# Patient Record
Sex: Female | Born: 1980 | Race: Black or African American | Hispanic: No | Marital: Single | Smoking: Never smoker
Health system: Southern US, Community
[De-identification: ages and names within clinical notes are randomized; demographics above are authoritative.]

## PROBLEM LIST (undated history)

## (undated) ENCOUNTER — Inpatient Hospital Stay (HOSPITAL_COMMUNITY): Payer: Self-pay

## (undated) DIAGNOSIS — IMO0002 Reserved for concepts with insufficient information to code with codable children: Secondary | ICD-10-CM

## (undated) DIAGNOSIS — N83209 Unspecified ovarian cyst, unspecified side: Secondary | ICD-10-CM

## (undated) DIAGNOSIS — M5430 Sciatica, unspecified side: Secondary | ICD-10-CM

## (undated) DIAGNOSIS — G510 Bell's palsy: Secondary | ICD-10-CM

## (undated) HISTORY — PX: NO PAST SURGERIES: SHX2092

## (undated) HISTORY — PX: WISDOM TOOTH EXTRACTION: SHX21

---
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
## 2002-01-13 ENCOUNTER — Inpatient Hospital Stay (HOSPITAL_COMMUNITY): Admission: AD | Admit: 2002-01-13 | Discharge: 2002-01-13 | Payer: Self-pay | Admitting: *Deleted

## 2002-01-17 ENCOUNTER — Inpatient Hospital Stay (HOSPITAL_COMMUNITY): Admission: AD | Admit: 2002-01-17 | Discharge: 2002-01-17 | Payer: Self-pay | Admitting: *Deleted

## 2002-03-19 ENCOUNTER — Emergency Department (HOSPITAL_COMMUNITY): Admission: EM | Admit: 2002-03-19 | Discharge: 2002-03-19 | Payer: Self-pay

## 2002-09-25 ENCOUNTER — Inpatient Hospital Stay (HOSPITAL_COMMUNITY): Admission: AD | Admit: 2002-09-25 | Discharge: 2002-09-25 | Payer: Self-pay | Admitting: Obstetrics and Gynecology

## 2003-08-29 ENCOUNTER — Inpatient Hospital Stay (HOSPITAL_COMMUNITY): Admission: AD | Admit: 2003-08-29 | Discharge: 2003-08-29 | Payer: Self-pay | Admitting: Obstetrics and Gynecology

## 2003-12-20 ENCOUNTER — Inpatient Hospital Stay (HOSPITAL_COMMUNITY): Admission: AD | Admit: 2003-12-20 | Discharge: 2003-12-20 | Payer: Self-pay | Admitting: *Deleted

## 2004-02-13 ENCOUNTER — Emergency Department (HOSPITAL_COMMUNITY): Admission: EM | Admit: 2004-02-13 | Discharge: 2004-02-13 | Payer: Self-pay | Admitting: Emergency Medicine

## 2004-08-08 ENCOUNTER — Inpatient Hospital Stay (HOSPITAL_COMMUNITY): Admission: AD | Admit: 2004-08-08 | Discharge: 2004-08-08 | Payer: Self-pay | Admitting: Obstetrics

## 2004-08-18 ENCOUNTER — Emergency Department (HOSPITAL_COMMUNITY): Admission: EM | Admit: 2004-08-18 | Discharge: 2004-08-18 | Payer: Self-pay | Admitting: Emergency Medicine

## 2004-08-20 ENCOUNTER — Emergency Department (HOSPITAL_COMMUNITY): Admission: EM | Admit: 2004-08-20 | Discharge: 2004-08-20 | Payer: Self-pay | Admitting: Emergency Medicine

## 2004-11-20 ENCOUNTER — Inpatient Hospital Stay (HOSPITAL_COMMUNITY): Admission: AD | Admit: 2004-11-20 | Discharge: 2004-11-20 | Payer: Self-pay | Admitting: Obstetrics

## 2005-02-14 ENCOUNTER — Inpatient Hospital Stay (HOSPITAL_COMMUNITY): Admission: AD | Admit: 2005-02-14 | Discharge: 2005-02-14 | Payer: Self-pay | Admitting: Obstetrics and Gynecology

## 2005-04-26 ENCOUNTER — Inpatient Hospital Stay (HOSPITAL_COMMUNITY): Admission: AD | Admit: 2005-04-26 | Discharge: 2005-04-26 | Payer: Self-pay | Admitting: Pediatrics

## 2005-05-20 ENCOUNTER — Inpatient Hospital Stay (HOSPITAL_COMMUNITY): Admission: AD | Admit: 2005-05-20 | Discharge: 2005-05-20 | Payer: Self-pay | Admitting: Obstetrics and Gynecology

## 2005-06-07 ENCOUNTER — Inpatient Hospital Stay (HOSPITAL_COMMUNITY): Admission: AD | Admit: 2005-06-07 | Discharge: 2005-06-07 | Payer: Self-pay | Admitting: Obstetrics and Gynecology

## 2005-06-18 ENCOUNTER — Inpatient Hospital Stay (HOSPITAL_COMMUNITY): Admission: AD | Admit: 2005-06-18 | Discharge: 2005-06-21 | Payer: Self-pay | Admitting: Obstetrics and Gynecology

## 2005-06-19 ENCOUNTER — Encounter (INDEPENDENT_AMBULATORY_CARE_PROVIDER_SITE_OTHER): Payer: Self-pay | Admitting: Specialist

## 2005-07-22 ENCOUNTER — Other Ambulatory Visit: Admission: RE | Admit: 2005-07-22 | Discharge: 2005-07-22 | Payer: Self-pay | Admitting: Obstetrics and Gynecology

## 2006-06-25 ENCOUNTER — Inpatient Hospital Stay (HOSPITAL_COMMUNITY): Admission: AD | Admit: 2006-06-25 | Discharge: 2006-06-25 | Payer: Self-pay | Admitting: Obstetrics and Gynecology

## 2006-07-14 ENCOUNTER — Inpatient Hospital Stay (HOSPITAL_COMMUNITY): Admission: AD | Admit: 2006-07-14 | Discharge: 2006-07-14 | Payer: Self-pay | Admitting: Obstetrics and Gynecology

## 2006-07-16 ENCOUNTER — Inpatient Hospital Stay (HOSPITAL_COMMUNITY): Admission: AD | Admit: 2006-07-16 | Discharge: 2006-07-16 | Payer: Self-pay | Admitting: Obstetrics & Gynecology

## 2006-10-26 ENCOUNTER — Inpatient Hospital Stay (HOSPITAL_COMMUNITY): Admission: AD | Admit: 2006-10-26 | Discharge: 2006-10-26 | Payer: Self-pay | Admitting: Obstetrics and Gynecology

## 2006-11-09 ENCOUNTER — Inpatient Hospital Stay (HOSPITAL_COMMUNITY): Admission: AD | Admit: 2006-11-09 | Discharge: 2006-11-09 | Payer: Self-pay | Admitting: Obstetrics and Gynecology

## 2006-12-01 ENCOUNTER — Inpatient Hospital Stay (HOSPITAL_COMMUNITY): Admission: AD | Admit: 2006-12-01 | Discharge: 2006-12-01 | Payer: Self-pay | Admitting: Obstetrics and Gynecology

## 2006-12-16 ENCOUNTER — Inpatient Hospital Stay (HOSPITAL_COMMUNITY): Admission: AD | Admit: 2006-12-16 | Discharge: 2006-12-16 | Payer: Self-pay | Admitting: Obstetrics and Gynecology

## 2006-12-27 ENCOUNTER — Inpatient Hospital Stay (HOSPITAL_COMMUNITY): Admission: AD | Admit: 2006-12-27 | Discharge: 2006-12-27 | Payer: Self-pay | Admitting: Obstetrics and Gynecology

## 2006-12-30 ENCOUNTER — Inpatient Hospital Stay (HOSPITAL_COMMUNITY): Admission: AD | Admit: 2006-12-30 | Discharge: 2007-01-02 | Payer: Self-pay | Admitting: Obstetrics and Gynecology

## 2007-05-16 ENCOUNTER — Emergency Department (HOSPITAL_COMMUNITY): Admission: EM | Admit: 2007-05-16 | Discharge: 2007-05-16 | Payer: Self-pay | Admitting: Emergency Medicine

## 2008-04-10 ENCOUNTER — Inpatient Hospital Stay (HOSPITAL_COMMUNITY): Admission: AD | Admit: 2008-04-10 | Discharge: 2008-04-10 | Payer: Self-pay | Admitting: Obstetrics and Gynecology

## 2008-04-30 ENCOUNTER — Emergency Department (HOSPITAL_COMMUNITY): Admission: EM | Admit: 2008-04-30 | Discharge: 2008-04-30 | Payer: Self-pay | Admitting: Emergency Medicine

## 2008-05-02 ENCOUNTER — Emergency Department (HOSPITAL_COMMUNITY): Admission: EM | Admit: 2008-05-02 | Discharge: 2008-05-03 | Payer: Self-pay | Admitting: Emergency Medicine

## 2008-05-06 ENCOUNTER — Emergency Department (HOSPITAL_COMMUNITY): Admission: EM | Admit: 2008-05-06 | Discharge: 2008-05-06 | Payer: Self-pay | Admitting: Emergency Medicine

## 2008-05-07 ENCOUNTER — Emergency Department (HOSPITAL_COMMUNITY): Admission: EM | Admit: 2008-05-07 | Discharge: 2008-05-07 | Payer: Self-pay | Admitting: Emergency Medicine

## 2009-05-11 ENCOUNTER — Inpatient Hospital Stay (HOSPITAL_COMMUNITY): Admission: AD | Admit: 2009-05-11 | Discharge: 2009-05-11 | Payer: Self-pay | Admitting: Obstetrics and Gynecology

## 2010-09-02 LAB — URINALYSIS, ROUTINE W REFLEX MICROSCOPIC
Bilirubin Urine: NEGATIVE
Glucose, UA: NEGATIVE mg/dL
Ketones, ur: NEGATIVE mg/dL
Nitrite: POSITIVE — AB
Protein, ur: 100 mg/dL — AB
Specific Gravity, Urine: >1.03 — ABNORMAL HIGH (ref 1.005–1.030)
Urobilinogen, UA: 0.2 mg/dL (ref 0.0–1.0)
pH: 5.5 (ref 5.0–8.0)

## 2010-09-02 LAB — URINE MICROSCOPIC-ADD ON

## 2010-10-14 NOTE — H&P (Signed)
Nicole Christian, Nicole Christian             ACCOUNT NO.:  [id number]   MEDICAL RECORD NO.:  [id number]          PATIENT TYPE:  INP   LOCATION:  9168                          FACILITY:  [facility]   PHYSICIAN:  Hal Morales, M.D.DATE OF BIRTH:  07/02/1980   DATE OF ADMISSION:  12/30/2006  DATE OF DISCHARGE:                              HISTORY & PHYSICAL   HISTORY OF PRESENT ILLNESS:  The patient is a 30 year old Gravida 2,  para 1-0-0-1, at 2 weeks who presents today with uterine contractions  all day.  She was seen in the office at approximately 1:30 today with  cervix one cm, 50% vertex, at a -1 to -2 station.  She was contracting  at that time every 3-4 minutes.  Essentially that contraction pattern  has persisted itself all day and is now down to 2-3 minutes.  The cervix  initially on evaluation in the maternity admissions unit was unchanged  and then after further evaluation and time the cervix did change to 2+  and 70%.  The contractions maintained themselves every 2-3 minutes and  were of increasingly strong quality.  The decision was made therefore to  admit the patient for further labor care.   The pregnancy has been remarkable for:  1. First trimester bleeding.  2. History of previous unstable lie with fetus breach at 37 weeks but      then with a spontaneous version.  3. Negative group B Strep.   PRENATAL LABORATORY STUDIES:  The blood type is O positive, Rh antibody  negative, VDRL nonreactive, rubella titer positive, hepatitis B surface  antigen negative, HIV nonreactive.  The cystic fibrosis testing was  negative.  GC and chlamydia cultures were negative in 01/07.  The Pap  smear was normal in 02/07.  The hemoglobin upon entering the practice  was 12.9.  It was within normal limits at 28 weeks.  Glucola was normal.  The patient had a negative fetal fibronectin at 30 weeks.  She entered  prenatal care too late for first trimester screening  A Pap smear was  done in April.  The  fetal fibronectin again was negative at 31 weeks.  The group B Strep. was negative at 36 weeks.   HISTORY OF PRESENT PREGNANCY:  The patient entered care at approximately  63 and 6/7ths weeks.  She had an ultrasound at that time which showed  normal growth and development.  Drug screen was declined  The Pap smear  was done in April and was normal.  She had a normal Glucola.  She was  seen at admission's unit at 30 weeks for some back pain.  She was  referred to physical therapy and placed on Flexeril.  She had severe  right sided pain at 31 weeks.  She was evaluated at the office and in  maternity admissions with no significant findings.  The right upper  quadrant pain still persisted for one or two weeks but then resolved.  She had further evaluation for a leaking of fluid at 36-37 weeks with  negative findings.  She was seen at 37 weeks and had an ultrasound  which  showed that the fetus was breech.  She had a visit 3-4 days later to  review external version versus cesarean section.  At that time the fetus  was vertex.  She had another evaluation for leaking of fluid at 38  weeks.  The rest of her pregnancy was essentially uncomplicated.   OBSTETRICAL HISTORY:  In 01/07 she had a vaginal birth of a female  infant, weight of 6 pounds 12 ounces at 40 and 4/7ths weeks.  She had an  epidural.  The child was born by Dr. Henderson Cloud.   MEDICAL HISTORY:  She is a previous condom user.  She had an ovarian  cyst with her last pregnancy that resolved spontaneously.  She  occasionally has yeast infections and bacterial infections.  She reports  the usual childhood illnesses.  She had one urinary tract infection in  the past.   ALLERGIES:  She has no known medication allergies.   FAMILY HISTORY:  Her maternal aunt and her maternal first cousins had  hypertension.  Her first cousin was also preeclamptic.  Her first cousin  had asthma.  Her first cousin had epilepsy.  The maternal aunt had a   stroke.  Her first cousin had some type of cancer.   GENETIC HISTORY:  The genetic history is remarkable for the maternal  aunt having twins.   SOCIAL HISTORY:  The patient is single.  The father of the baby has been  sporadically involved, his name is Nicole Christian.  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  Her partner is unemployed.  The patient is African-American and of the  [facility].  She has been followed by the certified nurse midwife  service in [place].  She denies any alcohol, drug, or  tobacco use during this pregnancy.   PHYSICAL EXAMINATION:  VITAL SIGNS:  The vital signs are stable.  The  patient is afebrile.  HEENT:  Within normal limits.  LUNGS:  The breath sounds are clear.  HEART:  Regular rate and rhythm without murmur.  BREASTS:  The breasts are soft and nontender.  ABDOMEN:  The fundal height is approximately 39 cm.  The estimated fetal  weight is 7 to 7-1/2 pounds.  The uterine contractions are every three  minutes, 60-90 seconds in duration, strong quality  The cervix is 2+,  70% vertex, and -1 to -2 station but well applied.  The fetal heart rate  is reactive.  EXTREMITIES:  The deep tendon reflexes are 2+ without clonus.  There is  a trace edema noted.   IMPRESSION:  1. Intrauterine pregnancy at 39 weeks.  2. Early labor.  3. Negative group B Strep.   PLAN:  1. Admit to the birthing suite, consult with Dr. Pennie Rushing, the      attending physician.  2. Routine certified nurse midwife orders.  3. Will plan artificial rupture of membranes once the patient is on      the birthing suite, and epidural and augmentation p.r.n.      Nicole Christian, C.N.M.      Hal Morales, M.D.  Electronically Signed    VLL/MEDQ  D:  12/30/2006  T:  12/30/2006  Job:  [id number]   cc:   Hal Morales, M.D.  Fax: [phone]   Nicole Reel. Emilee Christian, C.N.M.  Fax: [phone]

## 2011-03-03 LAB — URINALYSIS, ROUTINE W REFLEX MICROSCOPIC
Bilirubin Urine: NEGATIVE
Glucose, UA: NEGATIVE
Ketones, ur: NEGATIVE
Nitrite: POSITIVE — AB
Protein, ur: 100 — AB
Specific Gravity, Urine: >1.03 — ABNORMAL HIGH
Urobilinogen, UA: 0.2
pH: 6

## 2011-03-03 LAB — URINE CULTURE: Colony Count: >=100000

## 2011-03-03 LAB — WET PREP, GENITAL
Trich, Wet Prep: NONE SEEN
Yeast Wet Prep HPF POC: NONE SEEN

## 2011-03-03 LAB — URINE MICROSCOPIC-ADD ON

## 2011-03-03 LAB — RAPID STREP SCREEN (MED CTR MEBANE ONLY): Streptococcus, Group A Screen (Direct): NEGATIVE

## 2011-03-06 LAB — POCT I-STAT, CHEM 8
BUN: 8 mg/dL (ref 6–23)
Calcium, Ion: 1.12 mmol/L (ref 1.12–1.32)
Chloride: 102 mEq/L (ref 96–112)
Creatinine, Ser: 1 mg/dL (ref 0.4–1.2)
Glucose, Bld: 102 mg/dL — ABNORMAL HIGH (ref 70–99)
HCT: 39 % (ref 36.0–46.0)
Hemoglobin: 13.3 g/dL (ref 12.0–15.0)
Potassium: 3.4 mEq/L — ABNORMAL LOW (ref 3.5–5.1)
Sodium: 138 mEq/L (ref 135–145)
TCO2: 28 mmol/L (ref 0–100)

## 2011-03-06 LAB — CBC
HCT: 38.4 % (ref 36.0–46.0)
Hemoglobin: 12.9 g/dL (ref 12.0–15.0)
MCHC: 33.5 g/dL (ref 30.0–36.0)
MCV: 88.9 fL (ref 78.0–100.0)
Platelets: 258 10*3/uL (ref 150–400)
RBC: 4.32 MIL/uL (ref 3.87–5.11)
RDW: 13.8 % (ref 11.5–15.5)
WBC: 11.6 10*3/uL — ABNORMAL HIGH (ref 4.0–10.5)

## 2011-03-06 LAB — DIFFERENTIAL
Basophils Absolute: 0 10*3/uL (ref 0.0–0.1)
Basophils Relative: 0 % (ref 0–1)
Eosinophils Absolute: 0 10*3/uL (ref 0.0–0.7)
Eosinophils Relative: 0 % (ref 0–5)
Lymphocytes Relative: 22 % (ref 12–46)
Lymphs Abs: 2.5 10*3/uL (ref 0.7–4.0)
Monocytes Absolute: 1.6 10*3/uL — ABNORMAL HIGH (ref 0.1–1.0)
Monocytes Relative: 14 % — ABNORMAL HIGH (ref 3–12)
Neutro Abs: 7.4 10*3/uL (ref 1.7–7.7)
Neutrophils Relative %: 64 % (ref 43–77)

## 2011-03-06 LAB — MONONUCLEOSIS SCREEN: Mono Screen: NEGATIVE

## 2011-03-06 LAB — GONOCOCCUS CULTURE

## 2011-03-06 LAB — RAPID STREP SCREEN (MED CTR MEBANE ONLY): Streptococcus, Group A Screen (Direct): NEGATIVE

## 2011-03-16 LAB — CBC
HCT: 31.5 — ABNORMAL LOW
HCT: 33.7 — ABNORMAL LOW
Hemoglobin: 10.6 — ABNORMAL LOW
MCHC: 33.6
MCHC: 33.8
MCV: 84.8
MCV: 84.7
Platelets: 338
Platelets: 363
RBC: 3.72 — ABNORMAL LOW
RDW: 14.3 — ABNORMAL HIGH
WBC: 12.3 — ABNORMAL HIGH
WBC: 14.3 — ABNORMAL HIGH

## 2011-03-16 LAB — CCBB MATERNAL DONOR DRAW

## 2011-03-17 LAB — URINALYSIS, ROUTINE W REFLEX MICROSCOPIC
Bilirubin Urine: NEGATIVE
Glucose, UA: NEGATIVE
Ketones, ur: NEGATIVE
Nitrite: NEGATIVE
Protein, ur: NEGATIVE
pH: 6

## 2011-03-17 LAB — STREP B DNA PROBE: Strep Group B Ag: NEGATIVE

## 2011-03-17 LAB — GC/CHLAMYDIA PROBE AMP, GENITAL: Chlamydia, DNA Probe: NEGATIVE

## 2011-03-17 LAB — WET PREP, GENITAL: Clue Cells Wet Prep HPF POC: NONE SEEN

## 2011-03-19 LAB — DIFFERENTIAL
Eosinophils Absolute: 0.1
Lymphocytes Relative: 16
Lymphs Abs: 1.8
Neutro Abs: 8.2 — ABNORMAL HIGH
Neutrophils Relative %: 74

## 2011-03-19 LAB — COMPREHENSIVE METABOLIC PANEL
BUN: 3 — ABNORMAL LOW
CO2: 25
Calcium: 8.7
Creatinine, Ser: 0.46
GFR calc non Af Amer: >60
Glucose, Bld: 97

## 2011-03-19 LAB — LIPASE, BLOOD: Lipase: 15

## 2011-03-19 LAB — CBC
Hemoglobin: 11.9 — ABNORMAL LOW
MCHC: 34.4
MCV: 88.9
RBC: 3.9
RDW: 13.8

## 2011-10-03 ENCOUNTER — Encounter (HOSPITAL_COMMUNITY): Payer: Self-pay | Admitting: *Deleted

## 2011-10-03 ENCOUNTER — Inpatient Hospital Stay (HOSPITAL_COMMUNITY)
Admission: AD | Admit: 2011-10-03 | Discharge: 2011-10-03 | Disposition: A | Discharge status: Home / Self Care | Payer: Self-pay | Source: Ambulatory Visit | Attending: Obstetrics & Gynecology | Admitting: Obstetrics & Gynecology

## 2011-10-03 ENCOUNTER — Inpatient Hospital Stay (HOSPITAL_COMMUNITY): Payer: Self-pay

## 2011-10-03 DIAGNOSIS — O34599 Maternal care for other abnormalities of gravid uterus, unspecified trimester: Secondary | ICD-10-CM | POA: Insufficient documentation

## 2011-10-03 DIAGNOSIS — O26899 Other specified pregnancy related conditions, unspecified trimester: Secondary | ICD-10-CM

## 2011-10-03 DIAGNOSIS — N760 Acute vaginitis: Secondary | ICD-10-CM | POA: Insufficient documentation

## 2011-10-03 DIAGNOSIS — O2390 Unspecified genitourinary tract infection in pregnancy, unspecified trimester: Secondary | ICD-10-CM | POA: Insufficient documentation

## 2011-10-03 DIAGNOSIS — R109 Unspecified abdominal pain: Secondary | ICD-10-CM | POA: Insufficient documentation

## 2011-10-03 DIAGNOSIS — B9689 Other specified bacterial agents as the cause of diseases classified elsewhere: Secondary | ICD-10-CM | POA: Insufficient documentation

## 2011-10-03 DIAGNOSIS — A499 Bacterial infection, unspecified: Secondary | ICD-10-CM

## 2011-10-03 DIAGNOSIS — Z1389 Encounter for screening for other disorder: Secondary | ICD-10-CM

## 2011-10-03 DIAGNOSIS — Z3490 Encounter for supervision of normal pregnancy, unspecified, unspecified trimester: Secondary | ICD-10-CM

## 2011-10-03 DIAGNOSIS — N83209 Unspecified ovarian cyst, unspecified side: Secondary | ICD-10-CM | POA: Insufficient documentation

## 2011-10-03 HISTORY — DX: Bell's palsy: G51.0

## 2011-10-03 HISTORY — DX: Reserved for concepts with insufficient information to code with codable children: IMO0002

## 2011-10-03 LAB — URINALYSIS, ROUTINE W REFLEX MICROSCOPIC
Ketones, ur: NEGATIVE mg/dL
Nitrite: NEGATIVE
Specific Gravity, Urine: 1.025 (ref 1.005–1.030)
pH: 5.5 (ref 5.0–8.0)

## 2011-10-03 LAB — HCG, QUANTITATIVE, PREGNANCY: hCG, Beta Chain, Quant, S: 12572 m[IU]/mL — ABNORMAL HIGH (ref <5)

## 2011-10-03 LAB — URINE MICROSCOPIC-ADD ON

## 2011-10-03 LAB — CBC
Hemoglobin: 13.3 g/dL (ref 12.0–15.0)
MCHC: 33.2 g/dL (ref 30.0–36.0)
RDW: 13.1 % (ref 11.5–15.5)

## 2011-10-03 LAB — WET PREP, GENITAL

## 2011-10-03 LAB — DIFFERENTIAL
Basophils Absolute: 0 10*3/uL (ref 0.0–0.1)
Basophils Relative: 0 % (ref 0–1)
Monocytes Relative: 10 % (ref 3–12)
Neutro Abs: 4.8 10*3/uL (ref 1.7–7.7)
Neutrophils Relative %: 63 % (ref 43–77)

## 2011-10-03 LAB — ABO/RH: ABO/RH(D): O POS

## 2011-10-03 MED ORDER — CYCLOBENZAPRINE HCL 5 MG PO TABS
5.0000 mg | ORAL_TABLET | Freq: Once | ORAL | Status: AC
  Administered 2011-10-03: 5 mg via ORAL
  Filled 2011-10-03: qty 1

## 2011-10-03 MED ORDER — METRONIDAZOLE 500 MG PO TABS: 500.0000 mg | ORAL_TABLET | Freq: Two times a day (BID) | ORAL | Status: AC

## 2011-10-03 NOTE — MAU Note (Signed)
Pt reports having reports having lower abd pain and lower back pain that has been going on 2-3 weeks. Taking Excedrin and Ibuprofen for pain without relief. Pt also c/ of "neck pain on right side that hurts up  to her ear.

## 2011-10-03 NOTE — MAU Provider Note (Signed)
History     CSN: [id number]  Arrival date & time 10/03/11  1914   None     Chief Complaint  Patient presents with  . Abdominal Pain    HPI Nicole Christian is a 31 y.o. female @ [redacted]w[redacted]d gestation who presents to MAU for abdominal pain. The pain started 2 weeks ago. She describes the pain as sharp and comes and goes. Rates the pain as 8/10. Ibuprofen seems to make it a little better for a while but then it returns. Last took ibuprofen this morning. No birth control. Current sex partner x 3 years. No history of STI's. Last pap smear one year ago and was abnormal and had repeated and was normal. The history was provided by the patient.  Past Medical History  Diagnosis Date  . Bell's palsy   . Abnormal Pap smear     History reviewed. No pertinent past surgical history.  No family history on file.  History  Substance Use Topics  . Smoking status: Never Smoker   . Smokeless tobacco: Not on file  . Alcohol Use: 0.6 oz/week    1 Cans of beer per week    OB History    Grav Para Term Preterm Abortions TAB SAB Ect Mult Living   3 2 2       2       Review of Systems  Constitutional: Negative for fever, chills, diaphoresis and fatigue.  HENT: Positive for ear pain and neck pain. Negative for congestion, sore throat, facial swelling, neck stiffness, dental problem and sinus pressure.   Eyes: Negative for photophobia, pain and discharge.  Respiratory: Negative for cough, chest tightness and wheezing.   Cardiovascular: Negative for chest pain, palpitations and leg swelling.  Gastrointestinal: Positive for abdominal pain. Negative for nausea, vomiting, diarrhea, constipation and abdominal distention.  Genitourinary: Positive for vaginal discharge and pelvic pain. Negative for dysuria, urgency, frequency, flank pain, vaginal bleeding and difficulty urinating.  Musculoskeletal: Positive for back pain. Negative for myalgias and gait problem.  Skin: Negative for color change and rash.    Neurological: Negative for dizziness, speech difficulty, weakness, light-headedness, numbness and headaches.  Psychiatric/Behavioral: Negative for confusion and agitation. The patient is not nervous/anxious.     Allergies  Review of patient's allergies indicates no known allergies.  Home Medications  No current outpatient prescriptions on file.  BP 109/66  Pulse 83  Temp(Src) 99.2 F (37.3 C) (Oral)  Resp 18  LMP 08/30/2011  Physical Exam  Nursing note and vitals reviewed. Constitutional: She is oriented to person, place, and time. She appears well-developed and well-nourished.  HENT:  Head: Normocephalic.  Eyes: EOM are normal.  Neck: Neck supple.  Cardiovascular: Normal rate, regular rhythm and normal heart sounds.   Pulmonary/Chest: Effort normal and breath sounds normal.  Abdominal: Soft. Bowel sounds are normal. There is tenderness.       Tender with palpation lower abdomen. No guarding or rebound.  Genitourinary:       External genitalia without lesions. White discharge vaginal vault. Cervix long, closed, no CMT. No adnexal tenderness or mass palpated. Uterus slightly enlarged.  Musculoskeletal: Normal range of motion.  Neurological: She is alert and oriented to person, place, and time. No cranial nerve deficit.  Skin: Skin is warm and dry.  Psychiatric: She has a normal mood and affect. Her behavior is normal. Judgment and thought content normal.   Results for orders placed during the hospital encounter of 10/03/11 (from the past 24 hour(s))  URINALYSIS,  ROUTINE W REFLEX MICROSCOPIC     Status: Abnormal   Collection Time   10/03/11  9:46 AM      Component Value Range   Color, Urine YELLOW  YELLOW    APPearance HAZY (*) CLEAR    Specific Gravity, Urine 1.025  1.005 - 1.030    pH 5.5  5.0 - 8.0    Glucose, UA NEGATIVE  NEGATIVE (mg/dL)   Hgb urine dipstick TRACE (*) NEGATIVE    Bilirubin Urine NEGATIVE  NEGATIVE    Ketones, ur NEGATIVE  NEGATIVE (mg/dL)   Protein,  ur NEGATIVE  NEGATIVE (mg/dL)   Urobilinogen, UA 0.2  0.0 - 1.0 (mg/dL)   Nitrite NEGATIVE  NEGATIVE    Leukocytes, UA TRACE (*) NEGATIVE   URINE MICROSCOPIC-ADD ON     Status: Abnormal   Collection Time   10/03/11  9:46 AM      Component Value Range   Squamous Epithelial / LPF MANY (*) RARE    WBC, UA 3-6  <3 (WBC/hpf)   RBC / HPF 0-2  <3 (RBC/hpf)   Bacteria, UA MANY (*) RARE   POCT PREGNANCY, URINE     Status: Abnormal   Collection Time   10/03/11  9:55 AM      Component Value Range   Preg Test, Ur POSITIVE (*) NEGATIVE   ABO/RH     Status: Normal   Collection Time   10/03/11 10:50 AM      Component Value Range   ABO/RH(D) O POS    CBC     Status: Normal   Collection Time   10/03/11 10:51 AM      Component Value Range   WBC 7.7  4.0 - 10.5 (K/uL)   RBC 4.49  3.87 - 5.11 (MIL/uL)   Hemoglobin 13.3  12.0 - 15.0 (g/dL)   HCT 95.6  38.7 - 56.4 (%)   MCV 89.3  78.0 - 100.0 (fL)   MCH 29.6  26.0 - 34.0 (pg)   MCHC 33.2  30.0 - 36.0 (g/dL)   RDW 33.2  95.1 - 88.4 (%)   Platelets 270  150 - 400 (K/uL)  DIFFERENTIAL     Status: Normal   Collection Time   10/03/11 10:51 AM      Component Value Range   Neutrophils Relative 63  43 - 77 (%)   Neutro Abs 4.8  1.7 - 7.7 (K/uL)   Lymphocytes Relative 27  12 - 46 (%)   Lymphs Abs 2.1  0.7 - 4.0 (K/uL)   Monocytes Relative 10  3 - 12 (%)   Monocytes Absolute 0.8  0.1 - 1.0 (K/uL)   Eosinophils Relative 1  0 - 5 (%)   Eosinophils Absolute 0.1  0.0 - 0.7 (K/uL)   Basophils Relative 0  0 - 1 (%)   Basophils Absolute 0.0  0.0 - 0.1 (K/uL)  HCG, QUANTITATIVE, PREGNANCY     Status: Abnormal   Collection Time   10/03/11 10:51 AM      Component Value Range   hCG, Beta Chain, Quant, S 12572 (*) <5 (mIU/mL)   Ultrasound today shows a 5.6 week IUP with cardiac activity at 128. Left CLC, EDD 05/29/12. ED Course  Procedures   Assessment: Viable IUP @ 5.[redacted] weeks gestation with left CLC   Discomforts of pregnancy   Bacterial vaginosis  Plan:  Rx  Flagyl   Tylenol for discomfort   Start prenatal care   Pregnancy verification letter given. MDM   I  have reviewed this patient's vital signs, nurses notes, appropriate labs and imaging.

## 2011-10-03 NOTE — Discharge Instructions (Signed)
Start your prenatal care and prenatal vitamins. Return here as needed for problems.    ________________________________________     To schedule your Maternity Eligibility Appointment, please call [phone].  When you arrive for your appointment you must bring the following items or information listed below.  Your appointment will be rescheduled if you do not have these items or are 15 minutes late. If currently receiving Medicaid, you MUST bring: 1. Medicaid Card 2. Social Security Card 3. Picture ID 4. Proof of Pregnancy 5. Verification of current address if the address on Medicaid card is incorrect "postmarked mail" If not receiving Medicaid, you MUST bring: 1. Social Security Card 2. Picture ID 3. Birth Certificate (if available) Passport or *Green Card 4. Proof of Pregnancy 5. Verification of current address "postmarked mail" for each income presented. 6. Verification of insurance coverage, if any 7. Check stubs from each employer for the previous month (if unable to present check stub  for each week, we will accept check stub for the first and last week ill the same month.) If you can't locate check stubs, you must bring a letter from the employer(s) and it must have the following information on letterhead, typed, in English: o name of company o company telephone number o how long been with the company, if less than one month o how much person earns per hour o how many hours per week work o the gross pay the person earned for the previous month If you are 30 years old or less, you do not have to bring proof of income unless you work or live with the father of the baby and at that time we will need proof of income from you and/or the father of the baby. Green Card recipients are eligible for Medicaid for Pregnant Women (MPW)    Abdominal Pain During Pregnancy Abdominal discomfort is common in pregnancy. Most of the time, it does not cause harm. There are many causes of  abdominal pain. Some causes are more serious than others. Some of the causes of abdominal pain in pregnancy are easily diagnosed. Occasionally, the diagnosis takes time to understand. Other times, the cause is not determined. Abdominal pain can be a sign that something is very wrong with the pregnancy, or the pain may have nothing to do with the pregnancy at all. For this reason, always tell your caregiver if you have any abdominal discomfort. CAUSES Common and harmless causes of abdominal pain include:  Constipation.   Excess gas and bloating.   Round ligament pain. This is pain that is felt in the folds of the groin.   The position the baby or placenta is in.   Baby kicks.   Braxton-Hicks contractions. These are mild contractions that do not cause cervical dilation.  Serious causes of abdominal pain include:  Ectopic pregnancy. This happens when a fertilized egg implants outside of the uterus.   Miscarriage.   Preterm labor. This is when labor starts at less than 37 weeks of pregnancy.   Placental abruption. This is when the placenta partially or completely separates from the uterus.   Preeclampsia. This is often associated with high blood pressure and has been referred to as "toxemia in pregnancy."   Uterine or amniotic fluid infections.  Causes unrelated to pregnancy include:  Urinary tract infection.   Gallbladder stones or inflammation.   Hepatitis or other liver illness.   Intestinal problems, stomach flu, food poisoning, or ulcer.   Appendicitis.   Kidney (renal) stones.  Kidney infection (pylonephritis).  HOME CARE INSTRUCTIONS  For mild pain:  Do not have sexual intercourse or put anything in your vagina until your symptoms go away completely.   Get plenty of rest until your pain improves. If your pain does not improve in 1 hour, call your caregiver.   Drink clear fluids if you feel nauseous. Avoid solid food as long as you are uncomfortable or nauseous.     Only take medicine as directed by your caregiver.   Keep all follow-up appointments with your caregiver.  SEEK IMMEDIATE MEDICAL CARE IF:  You are bleeding, leaking fluid, or passing tissue from the vagina.   You have increasing pain or cramping.   You have persistent vomiting.   You have painful or bloody urination.   You have a fever.   You notice a decrease in your baby's movements.   You have extreme weakness or feel faint.   You have shortness of breath, with or without abdominal pain.   You develop a severe headache with abdominal pain.   You have abnormal vaginal discharge with abdominal pain.   You have persistent diarrhea.   You have abdominal pain that continues even after rest, or gets worse.  MAKE SURE YOU:   Understand these instructions.   Will watch your condition.   Will get help right away if you are not doing well or get worse.  Document Released: 05/18/2005 Document Revised: 05/07/2011 Document Reviewed: 12/12/2010 [facility] Patient Information 2012 [place], [place].Abdominal Pain During Pregnancy Abdominal discomfort is common in pregnancy. Most of the time, it does not cause harm. There are many causes of abdominal pain. Some causes are more serious than others. Some of the causes of abdominal pain in pregnancy are easily diagnosed. Occasionally, the diagnosis takes time to understand. Other times, the cause is not determined. Abdominal pain can be a sign that something is very wrong with the pregnancy, or the pain may have nothing to do with the pregnancy at all. For this reason, always tell your caregiver if you have any abdominal discomfort. CAUSES Common and harmless causes of abdominal pain include:  Constipation.   Excess gas and bloating.   Round ligament pain. This is pain that is felt in the folds of the groin.   The position the baby or placenta is in.   Baby kicks.   Braxton-Hicks contractions. These are mild contractions that do  not cause cervical dilation.  Serious causes of abdominal pain include:  Ectopic pregnancy. This happens when a fertilized egg implants outside of the uterus.   Miscarriage.   Preterm labor. This is when labor starts at less than 37 weeks of pregnancy.   Placental abruption. This is when the placenta partially or completely separates from the uterus.   Preeclampsia. This is often associated with high blood pressure and has been referred to as "toxemia in pregnancy."   Uterine or amniotic fluid infections.  Causes unrelated to pregnancy include:  Urinary tract infection.   Gallbladder stones or inflammation.   Hepatitis or other liver illness.   Intestinal problems, stomach flu, food poisoning, or ulcer.   Appendicitis.   Kidney (renal) stones.   Kidney infection (pylonephritis).  HOME CARE INSTRUCTIONS  For mild pain:  Do not have sexual intercourse or put anything in your vagina until your symptoms go away completely.   Get plenty of rest until your pain improves. If your pain does not improve in 1 hour, call your caregiver.   Drink clear fluids if  you feel nauseous. Avoid solid food as long as you are uncomfortable or nauseous.   Only take medicine as directed by your caregiver.   Keep all follow-up appointments with your caregiver.  SEEK IMMEDIATE MEDICAL CARE IF:  You are bleeding, leaking fluid, or passing tissue from the vagina.   You have increasing pain or cramping.   You have persistent vomiting.   You have painful or bloody urination.   You have a fever.   You notice a decrease in your baby's movements.   You have extreme weakness or feel faint.   You have shortness of breath, with or without abdominal pain.   You develop a severe headache with abdominal pain.   You have abnormal vaginal discharge with abdominal pain.   You have persistent diarrhea.   You have abdominal pain that continues even after rest, or gets worse.  MAKE SURE YOU:    Understand these instructions.   Will watch your condition.   Will get help right away if you are not doing well or get worse.  Document Released: 05/18/2005 Document Revised: 05/07/2011 Document Reviewed: 12/12/2010 [facility] Patient Information 2012 [place], [place].

## 2011-10-05 LAB — GC/CHLAMYDIA PROBE AMP, GENITAL: Chlamydia, DNA Probe: NEGATIVE

## 2011-10-05 NOTE — MAU Provider Note (Signed)
Agree with above note.  Nicole Christian 10/05/2011 2:23 PM

## 2011-10-31 ENCOUNTER — Encounter (HOSPITAL_COMMUNITY): Payer: Self-pay | Admitting: *Deleted

## 2011-10-31 ENCOUNTER — Inpatient Hospital Stay (HOSPITAL_COMMUNITY)
Admission: AD | Admit: 2011-10-31 | Discharge: 2011-10-31 | Disposition: A | Discharge status: Home / Self Care | Payer: Self-pay | Source: Ambulatory Visit | Attending: Family Medicine | Admitting: Family Medicine

## 2011-10-31 DIAGNOSIS — Z331 Pregnant state, incidental: Secondary | ICD-10-CM

## 2011-10-31 DIAGNOSIS — R109 Unspecified abdominal pain: Secondary | ICD-10-CM | POA: Insufficient documentation

## 2011-10-31 DIAGNOSIS — N760 Acute vaginitis: Secondary | ICD-10-CM | POA: Insufficient documentation

## 2011-10-31 DIAGNOSIS — B9689 Other specified bacterial agents as the cause of diseases classified elsewhere: Secondary | ICD-10-CM

## 2011-10-31 DIAGNOSIS — A499 Bacterial infection, unspecified: Secondary | ICD-10-CM | POA: Insufficient documentation

## 2011-10-31 DIAGNOSIS — R197 Diarrhea, unspecified: Secondary | ICD-10-CM

## 2011-10-31 DIAGNOSIS — O2390 Unspecified genitourinary tract infection in pregnancy, unspecified trimester: Secondary | ICD-10-CM | POA: Insufficient documentation

## 2011-10-31 LAB — URINALYSIS, ROUTINE W REFLEX MICROSCOPIC
Leukocytes, UA: NEGATIVE
Nitrite: NEGATIVE
Specific Gravity, Urine: 1.02 (ref 1.005–1.030)
pH: 7 (ref 5.0–8.0)

## 2011-10-31 LAB — WET PREP, GENITAL: Yeast Wet Prep HPF POC: NONE SEEN

## 2011-10-31 MED ORDER — METRONIDAZOLE 500 MG PO TABS: 500.0000 mg | ORAL_TABLET | Freq: Two times a day (BID) | ORAL | Status: AC

## 2011-10-31 NOTE — Discharge Instructions (Signed)
Bacterial Vaginosis Bacterial vaginosis (BV) is a vaginal infection where the normal balance of bacteria in the vagina is disrupted. The normal balance is then replaced by an overgrowth of certain bacteria. There are several different kinds of bacteria that can cause BV. BV is the most common vaginal infection in women of childbearing age. CAUSES   The cause of BV is not fully understood. BV develops when there is an increase or imbalance of harmful bacteria.   Some activities or behaviors can upset the normal balance of bacteria in the vagina and put women at increased risk including:   Having a new sex partner or multiple sex partners.   Douching.   Using an intrauterine device (IUD) for contraception.   It is not clear what role sexual activity plays in the development of BV. However, women that have never had sexual intercourse are rarely infected with BV.  Women do not get BV from toilet seats, bedding, swimming pools or from touching objects around them.  SYMPTOMS   Grey vaginal discharge.   A fish-like odor with discharge, especially after sexual intercourse.   Itching or burning of the vagina and vulva.   Burning or pain with urination.   Some women have no signs or symptoms at all.  DIAGNOSIS  Your caregiver must examine the vagina for signs of BV. Your caregiver will perform lab tests and look at the sample of vaginal fluid through a microscope. They will look for bacteria and abnormal cells (clue cells), a pH test higher than 4.5, and a positive amine test all associated with BV.  RISKS AND COMPLICATIONS   Pelvic inflammatory disease (PID).   Infections following gynecology surgery.   Developing HIV.   Developing herpes virus.  TREATMENT  Sometimes BV will clear up without treatment. However, all women with symptoms of BV should be treated to avoid complications, especially if gynecology surgery is planned. Female partners generally do not need to be treated. However,  BV may spread between female sex partners so treatment is helpful in preventing a recurrence of BV.   BV may be treated with antibiotics. The antibiotics come in either pill or vaginal cream forms. Either can be used with nonpregnant or pregnant women, but the recommended dosages differ. These antibiotics are not harmful to the baby.   BV can recur after treatment. If this happens, a second round of antibiotics will often be prescribed.   Treatment is important for pregnant women. If not treated, BV can cause a premature delivery, especially for a pregnant woman who had a premature birth in the past. All pregnant women who have symptoms of BV should be checked and treated.   For chronic reoccurrence of BV, treatment with a type of prescribed gel vaginally twice a week is helpful.  HOME CARE INSTRUCTIONS   Finish all medication as directed by your caregiver.   Do not have sex until treatment is completed.   Tell your sexual partner that you have a vaginal infection. They should see their caregiver and be treated if they have problems, such as a mild rash or itching.   Practice safe sex. Use condoms. Only have 1 sex partner.  PREVENTION  Basic prevention steps can help reduce the risk of upsetting the natural balance of bacteria in the vagina and developing BV:  Do not have sexual intercourse (be abstinent).   Do not douche.   Use all of the medicine prescribed for treatment of BV, even if the signs and symptoms go away.     Tell your sex partner if you have BV. That way, they can be treated, if needed, to prevent reoccurrence.  SEEK MEDICAL CARE IF:   Your symptoms are not improving after 3 days of treatment.   You have increased discharge, pain, or fever.  MAKE SURE YOU:   Understand these instructions.   Will watch your condition.   Will get help right away if you are not doing well or get worse.  FOR MORE INFORMATION  Division of STD Prevention (DSTDP), Centers for Disease  Control and Prevention: SolutionApps.co.za American Social Health Association (ASHA): www.ashastd.org  Document Released: 05/18/2005 Document Revised: 05/07/2011 Document Reviewed: 11/08/2008 [facility] Patient Information 2012 [place], [place].B.R.A.T. Diet Your doctor has recommended the B.R.A.T. diet for you or your child until the condition improves. This is often used to help control diarrhea and vomiting symptoms. If you or your child can tolerate clear liquids, you may have:  Bananas.   Rice.   Applesauce.   Toast (and other simple starches such as crackers, potatoes, noodles).  Be sure to avoid dairy products, meats, and fatty foods until symptoms are better. Fruit juices such as apple, grape, and prune juice can make diarrhea worse. Avoid these. Continue this diet for 2 days or as instructed by your caregiver. Document Released: 05/18/2005 Document Revised: 05/07/2011 Document Reviewed: 11/04/2006 [facility] Patient Information 2012 [place], [place].B.R.A.T. Diet Your doctor has recommended the B.R.A.T. diet for you or your child until the condition improves. This is often used to help control diarrhea and vomiting symptoms. If you or your child can tolerate clear liquids, you may have:  Bananas.   Rice.   Applesauce.   Toast (and other simple starches such as crackers, potatoes, noodles).  Be sure to avoid dairy products, meats, and fatty foods until symptoms are better. Fruit juices such as apple, grape, and prune juice can make diarrhea worse. Avoid these. Continue this diet for 2 days or as instructed by your caregiver. Document Released: 05/18/2005 Document Revised: 05/07/2011 Document Reviewed: 11/04/2006 [facility] Patient Information 2012 [place], [place].

## 2011-10-31 NOTE — MAU Note (Signed)
"  I've had cramping for the past 2 days.  Today I have had diarrhea as well about every 1.5-2 hours.  I saw some spotting when I collected urine after getting here."

## 2011-10-31 NOTE — MAU Provider Note (Signed)
History     CSN: [id number]  Arrival date & time 10/31/11  1000   None     Chief Complaint  Patient presents with  . Abdominal Cramping  . Diarrhea   HPI Nicole Christian is a 31 y.o. female @ [redacted]w[redacted]d gestation who presents to MAU for abdominal pain and diarrhea. She describes the pain as  cramping that started 3 days ago. She rates the pain as 8/10.  Diarrhea started last night and is worse today with watery brown stools every 2 hours. Also has heavy vaginal discharge. The history was provided by the patient.  Past Medical History  Diagnosis Date  . Bell's palsy   . Abnormal Pap smear     No past surgical history on file.  No family history on file.  History  Substance Use Topics  . Smoking status: Never Smoker   . Smokeless tobacco: Not on file  . Alcohol Use: 0.6 oz/week    1 Cans of beer per week    OB History    Grav Para Term Preterm Abortions TAB SAB Ect Mult Living   3 2 2       2       Review of Systems  Constitutional: Negative for fever, chills, diaphoresis and fatigue.  HENT: Negative for ear pain, congestion, sore throat, facial swelling, neck pain, neck stiffness, dental problem and sinus pressure.   Eyes: Negative for photophobia, pain, discharge and visual disturbance.  Respiratory: Positive for cough. Negative for chest tightness and wheezing.   Cardiovascular: Negative for chest pain, palpitations and leg swelling.  Gastrointestinal: Positive for nausea, abdominal pain and diarrhea. Negative for vomiting, constipation and abdominal distention.  Genitourinary: Positive for vaginal discharge. Negative for dysuria, urgency, frequency, flank pain, vaginal bleeding, difficulty urinating and dyspareunia.  Musculoskeletal: Positive for back pain. Negative for myalgias and gait problem.  Skin: Negative for color change and rash.  Neurological: Positive for headaches. Negative for dizziness, speech difficulty, weakness, light-headedness and numbness.    Psychiatric/Behavioral: Negative for confusion and agitation. The patient is not nervous/anxious.     Allergies  Review of patient's allergies indicates no known allergies.  Home Medications  No current outpatient prescriptions on file.  BP 116/71  Pulse 84  Temp(Src) 98.1 F (36.7 C) (Oral)  Resp 18  Ht 5\' 8"  (1.727 m)  Wt 193 lb 6.4 oz (87.726 kg)  BMI 29.41 kg/m2  LMP 08/20/2011  Physical Exam  Nursing note and vitals reviewed. Constitutional: She is oriented to person, place, and time. She appears well-developed and well-nourished. No distress.  HENT:  Head: Normocephalic.  Eyes: EOM are normal.  Neck: Neck supple.  Cardiovascular: Normal rate.   Pulmonary/Chest: Effort normal.  Abdominal: Soft. There is no tenderness.       Fetal heart tones 163.  Genitourinary:       External genitalia without lesions. Frothy discharge vaginal vault. Cervix closed, long, no CMT, no adnexal tenderness. Uterus approximately 10 week size.   Musculoskeletal: Normal range of motion.  Neurological: She is alert and oriented to person, place, and time. No cranial nerve deficit.  Skin: Skin is warm and dry.  Psychiatric: She has a normal mood and affect. Her behavior is normal. Judgment and thought content normal.   Results for orders placed during the hospital encounter of 10/31/11 (from the past 24 hour(s))  URINALYSIS, ROUTINE W REFLEX MICROSCOPIC     Status: Normal   Collection Time   10/31/11 10:15 AM  Component Value Range   Color, Urine YELLOW  YELLOW    APPearance CLEAR  CLEAR    Specific Gravity, Urine 1.020  1.005 - 1.030    pH 7.0  5.0 - 8.0    Glucose, UA NEGATIVE  NEGATIVE (mg/dL)   Hgb urine dipstick NEGATIVE  NEGATIVE    Bilirubin Urine NEGATIVE  NEGATIVE    Ketones, ur NEGATIVE  NEGATIVE (mg/dL)   Protein, ur NEGATIVE  NEGATIVE (mg/dL)   Urobilinogen, UA 0.2  0.0 - 1.0 (mg/dL)   Nitrite NEGATIVE  NEGATIVE    Leukocytes, UA NEGATIVE  NEGATIVE   WET PREP, GENITAL      Status: Abnormal   Collection Time   10/31/11 11:40 AM      Component Value Range   Yeast Wet Prep HPF POC NONE SEEN  NONE SEEN    Trich, Wet Prep NONE SEEN  NONE SEEN    Clue Cells Wet Prep HPF POC MODERATE (*) NONE SEEN    WBC, Wet Prep HPF POC MODERATE (*) NONE SEEN    Assessment: [redacted]w[redacted]d gestation   Bacterial vaginosis   Diarrhea  Plan:  Clear liquids then advance to B.R.A.T   Rx Flagyl for BV   Continue prenatal care   Return as needed. ED Course  Procedures    MDM

## 2011-10-31 NOTE — MAU Provider Note (Signed)
Chart reviewed and agree with management and plan.  

## 2011-11-02 LAB — GC/CHLAMYDIA PROBE AMP, GENITAL: Chlamydia, DNA Probe: NEGATIVE

## 2011-11-23 ENCOUNTER — Encounter (HOSPITAL_COMMUNITY): Payer: Self-pay | Admitting: *Deleted

## 2011-11-23 ENCOUNTER — Inpatient Hospital Stay (HOSPITAL_COMMUNITY)
Admission: AD | Admit: 2011-11-23 | Discharge: 2011-11-23 | Disposition: A | Discharge status: Home / Self Care | Payer: Medicaid Other | Source: Ambulatory Visit | Attending: Obstetrics and Gynecology | Admitting: Obstetrics and Gynecology

## 2011-11-23 DIAGNOSIS — R1032 Left lower quadrant pain: Secondary | ICD-10-CM | POA: Insufficient documentation

## 2011-11-23 DIAGNOSIS — O99891 Other specified diseases and conditions complicating pregnancy: Secondary | ICD-10-CM | POA: Insufficient documentation

## 2011-11-23 DIAGNOSIS — O210 Mild hyperemesis gravidarum: Secondary | ICD-10-CM | POA: Insufficient documentation

## 2011-11-23 DIAGNOSIS — J069 Acute upper respiratory infection, unspecified: Secondary | ICD-10-CM

## 2011-11-23 DIAGNOSIS — E860 Dehydration: Secondary | ICD-10-CM

## 2011-11-23 DIAGNOSIS — M7918 Myalgia, other site: Secondary | ICD-10-CM

## 2011-11-23 LAB — URINALYSIS, ROUTINE W REFLEX MICROSCOPIC
Bilirubin Urine: NEGATIVE
Glucose, UA: NEGATIVE mg/dL
Ketones, ur: 40 mg/dL — AB
pH: 5.5 (ref 5.0–8.0)

## 2011-11-23 LAB — URINE MICROSCOPIC-ADD ON

## 2011-11-23 MED ORDER — DIPHENHYDRAMINE HCL 50 MG/ML IJ SOLN
25.0000 mg | INTRAMUSCULAR | Status: AC
  Administered 2011-11-23: 25 mg via INTRAVENOUS
  Filled 2011-11-23: qty 1

## 2011-11-23 MED ORDER — DEXAMETHASONE SODIUM PHOSPHATE 10 MG/ML IJ SOLN
10.0000 mg | INTRAMUSCULAR | Status: AC
  Administered 2011-11-23: 10 mg via INTRAVENOUS
  Filled 2011-11-23: qty 1

## 2011-11-23 MED ORDER — LACTATED RINGERS IV BOLUS (SEPSIS)
1000.0000 mL | Freq: Once | INTRAVENOUS | Status: AC
  Administered 2011-11-23: 1000 mL via INTRAVENOUS

## 2011-11-23 MED ORDER — METOCLOPRAMIDE HCL 5 MG/ML IJ SOLN
10.0000 mg | INTRAMUSCULAR | Status: AC
  Administered 2011-11-23: 10 mg via INTRAVENOUS
  Filled 2011-11-23: qty 2

## 2011-11-23 MED ORDER — PROMETHAZINE HCL 12.5 MG PO TABS: 12.5000 mg | ORAL_TABLET | Freq: Four times a day (QID) | ORAL | Status: DC | PRN

## 2011-11-23 MED ORDER — ACETAMINOPHEN 500 MG PO TABS
1000.0000 mg | ORAL_TABLET | ORAL | Status: AC
  Administered 2011-11-23: 1000 mg via ORAL
  Filled 2011-11-23: qty 2

## 2011-11-23 NOTE — MAU Provider Note (Signed)
Agree with above note.  Nicole Christian 11/23/2011 1:24 PM

## 2011-11-23 NOTE — Discharge Instructions (Signed)
Upper Respiratory Infection, Adult An upper respiratory infection (URI) is also sometimes known as the common cold. The upper respiratory tract includes the nose, sinuses, throat, trachea, and bronchi. Bronchi are the airways leading to the lungs. Most people improve within 1 week, but symptoms can last up to 2 weeks. A residual cough may last even longer.  CAUSES Many different viruses can infect the tissues lining the upper respiratory tract. The tissues become irritated and inflamed and often become very moist. Mucus production is also common. A cold is contagious. You can easily spread the virus to others by oral contact. This includes kissing, sharing a glass, coughing, or sneezing. Touching your mouth or nose and then touching a surface, which is then touched by another person, can also spread the virus. SYMPTOMS  Symptoms typically develop 1 to 3 days after you come in contact with a cold virus. Symptoms vary from person to person. They may include:  Runny nose.   Sneezing.   Nasal congestion.   Sinus irritation.   Sore throat.   Loss of voice (laryngitis).   Cough.   Fatigue.   Muscle aches.   Loss of appetite.   Headache.   Low-grade fever.  DIAGNOSIS  You might diagnose your own cold based on familiar symptoms, since most people get a cold 2 to 3 times a year. Your caregiver can confirm this based on your exam. Most importantly, your caregiver can check that your symptoms are not due to another disease such as strep throat, sinusitis, pneumonia, asthma, or epiglottitis. Blood tests, throat tests, and X-rays are not necessary to diagnose a common cold, but they may sometimes be helpful in excluding other more serious diseases. Your caregiver will decide if any further tests are required. RISKS AND COMPLICATIONS  You may be at risk for a more severe case of the common cold if you smoke cigarettes, have chronic heart disease (such as heart failure) or lung disease (such as  asthma), or if you have a weakened immune system. The very young and very old are also at risk for more serious infections. Bacterial sinusitis, middle ear infections, and bacterial pneumonia can complicate the common cold. The common cold can worsen asthma and chronic obstructive pulmonary disease (COPD). Sometimes, these complications can require emergency medical care and may be life-threatening. PREVENTION  The best way to protect against getting a cold is to practice good hygiene. Avoid oral or hand contact with people with cold symptoms. Wash your hands often if contact occurs. There is no clear evidence that vitamin C, vitamin E, echinacea, or exercise reduces the chance of developing a cold. However, it is always recommended to get plenty of rest and practice good nutrition. TREATMENT  Treatment is directed at relieving symptoms. There is no cure. Antibiotics are not effective, because the infection is caused by a virus, not by bacteria. Treatment may include:  Increased fluid intake. Sports drinks offer valuable electrolytes, sugars, and fluids.   Breathing heated mist or steam (vaporizer or shower).   Eating chicken soup or other clear broths, and maintaining good nutrition.   Getting plenty of rest.   Using gargles or lozenges for comfort.   Controlling fevers with ibuprofen or acetaminophen as directed by your caregiver.   Increasing usage of your inhaler if you have asthma.  Zinc gel and zinc lozenges, taken in the first 24 hours of the common cold, can shorten the duration and lessen the severity of symptoms. Pain medicines may help with fever, muscle   aches, and throat pain. A variety of non-prescription medicines are available to treat congestion and runny nose. Your caregiver can make recommendations and may suggest nasal or lung inhalers for other symptoms.  HOME CARE INSTRUCTIONS   Only take over-the-counter or prescription medicines for pain, discomfort, or fever as directed  by your caregiver.   Use a warm mist humidifier or inhale steam from a shower to increase air moisture. This may keep secretions moist and make it easier to breathe.   Drink enough water and fluids to keep your urine clear or pale yellow.   Rest as needed.   Return to work when your temperature has returned to normal or as your caregiver advises. You may need to stay home longer to avoid infecting others. You can also use a face mask and careful hand washing to prevent spread of the virus.  SEEK MEDICAL CARE IF:   After the first few days, you feel you are getting worse rather than better.   You need your caregiver's advice about medicines to control symptoms.   You develop chills, worsening shortness of breath, or brown or red sputum. These may be signs of pneumonia.   You develop yellow or brown nasal discharge or pain in the face, especially when you bend forward. These may be signs of sinusitis.   You develop a fever, swollen neck glands, pain with swallowing, or white areas in the back of your throat. These may be signs of strep throat.  SEEK IMMEDIATE MEDICAL CARE IF:   You have a fever.   You develop severe or persistent headache, ear pain, sinus pain, or chest pain.   You develop wheezing, a prolonged cough, cough up blood, or have a change in your usual mucus (if you have chronic lung disease).   You develop sore muscles or a stiff neck.  Document Released: 11/11/2000 Document Revised: 05/07/2011 Document Reviewed: 09/19/2010 [facility] Patient Information 2012 [place], [place].  Musculoskeletal Pain Musculoskeletal pain is muscle and boney aches and pains. These pains can occur in any part of the body. Your caregiver may treat you without knowing the cause of the pain. They may treat you if blood or urine tests, X-rays, and other tests were normal.  CAUSES There is often not a definite cause or reason for these pains. These pains may be caused by a type of germ (virus). The  discomfort may also come from overuse. Overuse includes working out too hard when your body is not fit. Boney aches also come from weather changes. Bone is sensitive to atmospheric pressure changes. HOME CARE INSTRUCTIONS   Ask when your test results will be ready. Make sure you get your test results.   Only take over-the-counter or prescription medicines for pain, discomfort, or fever as directed by your caregiver. If you were given medications for your condition, do not drive, operate machinery or power tools, or sign legal documents for 24 hours. Do not drink alcohol. Do not take sleeping pills or other medications that may interfere with treatment.   Continue all activities unless the activities cause more pain. When the pain lessens, slowly resume normal activities. Gradually increase the intensity and duration of the activities or exercise.   During periods of severe pain, bed rest may be helpful. Lay or sit in any position that is comfortable.   Putting ice on the injured area.   Put ice in a bag.   Place a towel between your skin and the bag.   Leave the ice  on for 15 to 20 minutes, 3 to 4 times a day.   Follow up with your caregiver for continued problems and no reason can be found for the pain. If the pain becomes worse or does not go away, it may be necessary to repeat tests or do additional testing. Your caregiver may need to look further for a possible cause.  SEEK IMMEDIATE MEDICAL CARE IF:  You have pain that is getting worse and is not relieved by medications.   You develop chest pain that is associated with shortness or breath, sweating, feeling sick to your stomach (nauseous), or throw up (vomit).   Your pain becomes localized to the abdomen.   You develop any new symptoms that seem different or that concern you.  MAKE SURE YOU:   Understand these instructions.   Will watch your condition.   Will get help right away if you are not doing well or get worse.    Document Released: 05/18/2005 Document Revised: 05/07/2011 Document Reviewed: 01/06/2008 [facility] Patient Information 2012 [place], [place].

## 2011-11-23 NOTE — MAU Note (Signed)
Pain in L side and around to back. C/O sore throat, HA, weak, loss of appetite, vomiting, general malaise. Started 2-3 days ago.

## 2011-11-23 NOTE — MAU Provider Note (Signed)
Nicole T Massey30 y.o.G3P2002 @[redacted]w[redacted]d  by LMP Chief Complaint  Patient presents with  . Abdominal Pain     First Provider Initiated Contact with Patient 11/23/11 0919      SUBJECTIVE  HPI: Pt presents to MAU with report of constant LLQ and left flank pain starting last night, h/a and sinus pressure today, and sore throat and cough x4 days.  She reports that since the sore throat and coughing, she has not had an appetite and has felt more nauseous than usual, vomiting x4 in 48 hours.  She denies LOF, vaginal bleeding, vaginal itching/burning, urinary symptoms, dizziness, or fever/chills.    Past Medical History  Diagnosis Date  . Bell's palsy   . Abnormal Pap smear    Past Surgical History  Procedure Date  . Vaginal delivery     X2   History   Social History  . Marital Status: Single    Spouse Name: N/A    Number of Children: N/A  . Years of Education: N/A   Occupational History  . Not on file.   Social History Main Topics  . Smoking status: Never Smoker   . Smokeless tobacco: Never Used  . Alcohol Use: 0.6 oz/week    1 Cans of beer per week  . Drug Use: No  . Sexually Active: Yes    Birth Control/ Protection: None   Other Topics Concern  . Not on file   Social History Narrative  . No narrative on file   No current facility-administered medications on file prior to encounter.   Current Outpatient Prescriptions on File Prior to Encounter  Medication Sig Dispense Refill  . Prenatal Vit-Fe Fumarate-FA (PRENATAL MULTIVITAMIN) TABS Take 1 tablet by mouth every morning.       No Known Allergies  ROS: Pertinent items in HPI  OBJECTIVE Blood pressure 108/65, pulse 92, temperature 98.3 F (36.8 C), temperature source Oral, resp. rate 18, last menstrual period 08/20/2011.  GENERAL: Well-developed, well-nourished female in mild distress.  HEENT: Normocephalic, good dentition HEART: normal rate RESP: normal effort ABDOMEN: Soft, LLQ tender to palpation from  inguinal region along left flank and to left lower back,  Neg CVA tenderness, Some guarding, negative rebound, negative McBurney's point tenderness EXTREMITIES: Nontender, no edema NEURO: Alert and oriented SPECULUM EXAM: Deferred   LAB RESULTS Results for orders placed during the hospital encounter of 11/23/11 (from the past 24 hour(s))  URINALYSIS, ROUTINE W REFLEX MICROSCOPIC     Status: Abnormal   Collection Time   11/23/11  9:35 AM      Component Value Range   Color, Urine YELLOW  YELLOW   APPearance HAZY (*) CLEAR   Specific Gravity, Urine >1.030 (*) 1.005 - 1.030   pH 5.5  5.0 - 8.0   Glucose, UA NEGATIVE  NEGATIVE mg/dL   Hgb urine dipstick SMALL (*) NEGATIVE   Bilirubin Urine NEGATIVE  NEGATIVE   Ketones, ur 40 (*) NEGATIVE mg/dL   Protein, ur NEGATIVE  NEGATIVE mg/dL   Urobilinogen, UA 0.2  0.0 - 1.0 mg/dL   Nitrite NEGATIVE  NEGATIVE   Leukocytes, UA SMALL (*) NEGATIVE  URINE MICROSCOPIC-ADD ON     Status: Abnormal   Collection Time   11/23/11  9:35 AM      Component Value Range   Squamous Epithelial / LPF FEW (*) RARE   WBC, UA 3-6  <3 WBC/hpf   Bacteria, UA FEW (*) RARE   Urine-Other MUCOUS PRESENT      ASSESSMENT URI Dehydration  PLAN IV LR x1000 ml bolus Reglan 10 mg IV Benadryl 25 mg IV Decadron 10 mg IV Tylenol 1000 mg PO  Pt reports complete relief from h/a and throat pain is decreased.  She is concerned about keeping down fluids at home with her n/v.    D/C home  Urine sent for culture Phenergan 12.5 mg PO Q 6 hours PRN F/U with Nestor Ramp as scheduled to begin prenatal care Return to MAU if symptoms worsen   LEFTWICH-KIRBY, Shaneisha Burkel 11/23/2011 10:25 AM

## 2011-11-24 LAB — URINE CULTURE

## 2011-11-25 ENCOUNTER — Other Ambulatory Visit: Payer: Self-pay

## 2011-12-07 ENCOUNTER — Inpatient Hospital Stay (HOSPITAL_COMMUNITY): Payer: Medicaid Other

## 2011-12-07 ENCOUNTER — Encounter (HOSPITAL_COMMUNITY): Payer: Self-pay | Admitting: *Deleted

## 2011-12-07 ENCOUNTER — Inpatient Hospital Stay (HOSPITAL_COMMUNITY)
Admission: AD | Admit: 2011-12-07 | Discharge: 2011-12-07 | Disposition: A | Discharge status: Home / Self Care | Payer: Medicaid Other | Source: Ambulatory Visit | Attending: Obstetrics & Gynecology | Admitting: Obstetrics & Gynecology

## 2011-12-07 DIAGNOSIS — W19XXXA Unspecified fall, initial encounter: Secondary | ICD-10-CM | POA: Insufficient documentation

## 2011-12-07 DIAGNOSIS — R1032 Left lower quadrant pain: Secondary | ICD-10-CM | POA: Insufficient documentation

## 2011-12-07 DIAGNOSIS — O0930 Supervision of pregnancy with insufficient antenatal care, unspecified trimester: Secondary | ICD-10-CM | POA: Insufficient documentation

## 2011-12-07 DIAGNOSIS — O99891 Other specified diseases and conditions complicating pregnancy: Secondary | ICD-10-CM | POA: Insufficient documentation

## 2011-12-07 HISTORY — DX: Sciatica, unspecified side: M54.30

## 2011-12-07 HISTORY — DX: Unspecified ovarian cyst, unspecified side: N83.209

## 2011-12-07 LAB — URINALYSIS, ROUTINE W REFLEX MICROSCOPIC
Glucose, UA: NEGATIVE mg/dL
Ketones, ur: NEGATIVE mg/dL
Leukocytes, UA: NEGATIVE
Specific Gravity, Urine: 1.02 (ref 1.005–1.030)
pH: 7 (ref 5.0–8.0)

## 2011-12-07 LAB — URINE MICROSCOPIC-ADD ON

## 2011-12-07 MED ORDER — KETOROLAC TROMETHAMINE 60 MG/2ML IM SOLN
60.0000 mg | Freq: Once | INTRAMUSCULAR | Status: AC
  Administered 2011-12-07: 60 mg via INTRAMUSCULAR
  Filled 2011-12-07: qty 2

## 2011-12-07 NOTE — MAU Note (Signed)
Tripped over the curb and fell onto her abdomen on Sat night; c/oconstant cramping on L lower abdomen that has progressively gotten worse since her fall; hx of ovarian cyst on the L side; dopplered FHR @ 153; no active bleeding but noticed pinkish discharge when she wipes; has not started her prenatal care yet but has an appointment in a couple of weeks; Holding pressure and rocking back and forth helps pt's pain;

## 2011-12-07 NOTE — Progress Notes (Signed)
Patient ID: Nicole Christian, female   DOB: June 10, 1980, 31 y.o.   MRN: [id number]  History  CSN: [id number] Arrival date and time: 12/07/11 1301  First Provider Initiated Contact with Patient 12/07/11 1458    Chief Complaint  Patient presents with  . Fall  . Abdominal Pain   HPI Patient is a 31 yo woman, G3P2002 @ [redacted]w[redacted]d by LMP with significant PMH of left sided ovarian cyst who presents s/p a fall on 12/05/11 (2 days ago). The patient reports missing the curb while walking and fell onto her outstretched hands striking her left abdomen and side on the ground. She denies hitting her head or sustaining any bleeding or bruising. Patient reports her pain is almost constant in nature, 9/10, and has not subsided since falling. She says that holding her left side with her hands and rocking back and forth make her feel better and laying on her left side makes it feel worse. She has tried taking tylenol for the pain with no relief. She does report this pain feels similar to her ovarian cyst pain but seems to be worse. Shortly after the fall she also noticed some red/pink discoloration upon wiping and was unsure if it was due to blood in her urine or not. She denies fevers, chills, night sweats, N/V/D.  OB History    Grav Para Term Preterm Abortions TAB SAB Ect Mult Living   3 2 2       2       Past Medical History  Diagnosis Date  . Bell's palsy   . Abnormal Pap smear   . Ovarian cyst   . Sciatica     Past Surgical History  Procedure Date  . Vaginal delivery     X2    Family History  Problem Relation Age of Onset  . Other Neg Hx     History  Substance Use Topics  . Smoking status: Never Smoker   . Smokeless tobacco: Never Used  . Alcohol Use: 0.6 oz/week    1 Cans of beer per week    Allergies: No Known Allergies  Prescriptions prior to admission  Medication Sig Dispense Refill  . acetaminophen (TYLENOL) 500 MG tablet Take 1,000 mg by mouth every 6 (six) hours as needed. For pain       . Prenatal Vit-Fe Fumarate-FA (PRENATAL MULTIVITAMIN) TABS Take 1 tablet by mouth every morning.        Review of Systems  Constitutional: Negative.   HENT: Negative.   Eyes: Negative.   Respiratory: Negative.   Cardiovascular: Negative.   Gastrointestinal: Positive for abdominal pain ( left lower abdomen).  Genitourinary: Positive for hematuria ( red/pink upon wiping).  Musculoskeletal: Positive for falls ( left side).  Skin: Negative.   Neurological: Negative.   Endo/Heme/Allergies: Negative.   Psychiatric/Behavioral: Negative.    Physical Exam   Blood pressure 97/52, pulse 104, temperature 98.4 F (36.9 C), temperature source Oral, resp. rate 16, height 5\' 6"  (1.676 m), weight 86.637 kg (191 lb), last menstrual period 08/20/2011, SpO2 100.00%.  Physical Exam  Constitutional: She is oriented to person, place, and time. She appears well-developed and well-nourished.  HENT:  Head: Normocephalic and atraumatic.  Eyes: Conjunctivae are normal.  Neck: Normal range of motion. Neck supple.  Cardiovascular: Normal rate, regular rhythm and normal heart sounds.  Exam reveals no friction rub.   No murmur heard. Respiratory: Effort normal and breath sounds normal.  GI: Soft. Bowel sounds are normal.  Genitourinary: Vagina normal. No  bleeding around the vagina. No vaginal discharge found.  Musculoskeletal: Normal range of motion.  Neurological: She is alert and oriented to person, place, and time.  Skin: Skin is warm and dry.  Psychiatric: She has a normal mood and affect. Her behavior is normal. Judgment and thought content normal.    Results for orders placed during the hospital encounter of 12/07/11 (from the past 24 hour(s))  URINALYSIS, ROUTINE W REFLEX MICROSCOPIC     Status: Abnormal   Collection Time   12/07/11  1:15 PM      Component Value Range   Color, Urine YELLOW  YELLOW   APPearance HAZY (*) CLEAR   Specific Gravity, Urine 1.020  1.005 - 1.030   pH 7.0  5.0 - 8.0     Glucose, UA NEGATIVE  NEGATIVE mg/dL   Hgb urine dipstick LARGE (*) NEGATIVE   Bilirubin Urine NEGATIVE  NEGATIVE   Ketones, ur NEGATIVE  NEGATIVE mg/dL   Protein, ur NEGATIVE  NEGATIVE mg/dL   Urobilinogen, UA 1.0  0.0 - 1.0 mg/dL   Nitrite NEGATIVE  NEGATIVE   Leukocytes, UA NEGATIVE  NEGATIVE  URINE MICROSCOPIC-ADD ON     Status: Abnormal   Collection Time   12/07/11  1:15 PM      Component Value Range   Squamous Epithelial / LPF MANY (*) RARE   WBC, UA 0-2  <3 WBC/hpf   RBC / HPF 3-6  <3 RBC/hpf   Bacteria, UA FEW (*) RARE   Urine-Other MUCOUS PRESENT       MAU Course  Procedures  MDM - [place] normal - Toradol 60mg  IM given in MAU  Assessment and Plan  1) Left adnexal pain 2/2 fall and ovarian cyst irritation 2) d/c home with bleeding precautions 3) may take tylenol as needed for pain  Consulted with Dr. Macon Large who agrees with plan of care.   Lewie Chamber 12/07/2011, 3:15 PM   [facility]

## 2011-12-07 NOTE — MAU Note (Signed)
Patient states she fell on 7-6 and hit her left side. States she has been having abdominal pain and spotting since that time. Patient is not wearing a pad. No active bleeding.

## 2012-03-01 LAB — OB RESULTS CONSOLE RPR: RPR: NONREACTIVE

## 2012-04-01 ENCOUNTER — Encounter (HOSPITAL_COMMUNITY): Payer: Self-pay | Admitting: *Deleted

## 2012-04-01 ENCOUNTER — Inpatient Hospital Stay (HOSPITAL_COMMUNITY)
Admission: AD | Admit: 2012-04-01 | Discharge: 2012-04-01 | Disposition: A | Discharge status: Home / Self Care | Payer: Medicaid Other | Source: Ambulatory Visit | Attending: Obstetrics & Gynecology | Admitting: Obstetrics & Gynecology

## 2012-04-01 DIAGNOSIS — B9689 Other specified bacterial agents as the cause of diseases classified elsewhere: Secondary | ICD-10-CM | POA: Insufficient documentation

## 2012-04-01 DIAGNOSIS — R109 Unspecified abdominal pain: Secondary | ICD-10-CM | POA: Insufficient documentation

## 2012-04-01 DIAGNOSIS — A499 Bacterial infection, unspecified: Secondary | ICD-10-CM | POA: Insufficient documentation

## 2012-04-01 DIAGNOSIS — IMO0001 Reserved for inherently not codable concepts without codable children: Secondary | ICD-10-CM

## 2012-04-01 DIAGNOSIS — O2390 Unspecified genitourinary tract infection in pregnancy, unspecified trimester: Secondary | ICD-10-CM | POA: Insufficient documentation

## 2012-04-01 DIAGNOSIS — K219 Gastro-esophageal reflux disease without esophagitis: Secondary | ICD-10-CM | POA: Diagnosis present

## 2012-04-01 DIAGNOSIS — N760 Acute vaginitis: Secondary | ICD-10-CM | POA: Insufficient documentation

## 2012-04-01 DIAGNOSIS — M7918 Myalgia, other site: Secondary | ICD-10-CM | POA: Diagnosis present

## 2012-04-01 LAB — COMPREHENSIVE METABOLIC PANEL
ALT: 14 U/L (ref 0–35)
AST: 15 U/L (ref 0–37)
Albumin: 2.7 g/dL — ABNORMAL LOW (ref 3.5–5.2)
Alkaline Phosphatase: 67 U/L (ref 39–117)
Potassium: 3.5 mEq/L (ref 3.5–5.1)
Sodium: 135 mEq/L (ref 135–145)
Total Protein: 5.9 g/dL — ABNORMAL LOW (ref 6.0–8.3)

## 2012-04-01 LAB — URINALYSIS, ROUTINE W REFLEX MICROSCOPIC
Bilirubin Urine: NEGATIVE
Hgb urine dipstick: NEGATIVE
Ketones, ur: 15 mg/dL — AB
Protein, ur: NEGATIVE mg/dL
Urobilinogen, UA: 0.2 mg/dL (ref 0.0–1.0)

## 2012-04-01 LAB — WET PREP, GENITAL: Yeast Wet Prep HPF POC: NONE SEEN

## 2012-04-01 LAB — CBC
MCHC: 33.5 g/dL (ref 30.0–36.0)
Platelets: 261 10*3/uL (ref 150–400)
RDW: 14.2 % (ref 11.5–15.5)

## 2012-04-01 LAB — AMYLASE: Amylase: 67 U/L (ref 0–105)

## 2012-04-01 MED ORDER — METRONIDAZOLE 500 MG PO TABS: 500.0000 mg | ORAL_TABLET | Freq: Two times a day (BID) | ORAL | Status: DC

## 2012-04-01 MED ORDER — GI COCKTAIL ~~LOC~~
30.0000 mL | ORAL | Status: AC
  Administered 2012-04-01: 30 mL via ORAL
  Filled 2012-04-01: qty 30

## 2012-04-01 MED ORDER — LANSOPRAZOLE 30 MG PO CPDR: 30.0000 mg | DELAYED_RELEASE_CAPSULE | Freq: Every day | ORAL | Status: DC

## 2012-04-01 NOTE — MAU Provider Note (Signed)
Chief Complaint:  Abdominal Pain   First Provider Initiated Contact with Patient 04/01/12 1650      HPI: Nicole Christian is a 31 y.o. G3P2002 at [redacted]w[redacted]d who presents to maternity admissions reporting constant sharp pain across her mid-upper abdomen with intermittent tightening all over her abdomen.  She also reports some pelvic pressure over the last few days and an increase in discharge.  Yesterday, she vomited x2 after the onset of her pain but she has had no vomiting today. She reports good fetal movement, denies LOF, vaginal bleeding, vaginal itching/burning, urinary symptoms, h/a, dizziness, or fever/chills.    While pt waiting for lab results, she reports that she actually vomited before the onset of pain, not after and she vomited 2-3 times in a row which was quite uncomfortable.    Past Medical History: Past Medical History  Diagnosis Date  . Bell's palsy   . Ovarian cyst   . Sciatica   . Abnormal Pap smear     repeat was fine     Past Surgical History: Past Surgical History  Procedure Date  . Vaginal delivery     X2  . No past surgeries     Family History: Family History  Problem Relation Age of Onset  . Other Neg Hx   . Hypertension Mother   . Diabetes Mother   . Hypertension Maternal Aunt   . Heart disease Maternal Grandmother     died from heart attack    Social History: History  Substance Use Topics  . Smoking status: Never Smoker   . Smokeless tobacco: Never Used  . Alcohol Use: 0.6 oz/week    1 Cans of beer per week     not with preg    Allergies: No Known Allergies  Meds:  Prescriptions prior to admission  Medication Sig Dispense Refill  . acetaminophen (TYLENOL) 500 MG tablet Take 1,000 mg by mouth every 6 (six) hours as needed. For pain      . Prenatal Vit-Fe Fumarate-FA (PRENATAL MULTIVITAMIN) TABS Take 1 tablet by mouth every morning.      . promethazine (PHENERGAN) 25 MG tablet Take 25 mg by mouth every 6 (six) hours as needed. Head aches         ROS: Pertinent findings in history of present illness.  Physical Exam  Blood pressure 126/63, pulse 99, temperature 98.3 F (36.8 C), temperature source Oral, resp. rate 18, height 5\' 8"  (1.727 m), weight 92.08 kg (203 lb), last menstrual period 08/20/2011. GENERAL: Well-developed, well-nourished female in no acute distress.  HEENT: normocephalic HEART: normal rate RESP: normal effort ABDOMEN: Soft, gravid appropriate for gestational age, mildly tender at umbilicus and across mid abdomen, no rebound, no guarding, pt more uncomfortable with abdominal movement, sitting up, than with palpation Neg CVA tenderness EXTREMITIES: Nontender, no edema NEURO: alert and oriented Pelvic exam: Cervix pink, visually closed, without lesion, scant white creamy discharge, vaginal walls and external genitalia normal  Dilation: Closed Effacement (%): Thick Cervical Position: Posterior Exam by:: Misty Stanley CNM    FHT:  Baseline 140 , moderate variability, accelerations present, no decelerations Contractions: None on toco, none by palpation   Labs: Results for orders placed during the hospital encounter of 04/01/12 (from the past 24 hour(s))  URINALYSIS, ROUTINE W REFLEX MICROSCOPIC     Status: Abnormal   Collection Time   04/01/12  4:23 PM      Component Value Range   Color, Urine YELLOW  YELLOW   APPearance HAZY (*) CLEAR  Specific Gravity, Urine 1.025  1.005 - 1.030   pH 6.5  5.0 - 8.0   Glucose, UA NEGATIVE  NEGATIVE mg/dL   Hgb urine dipstick NEGATIVE  NEGATIVE   Bilirubin Urine NEGATIVE  NEGATIVE   Ketones, ur 15 (*) NEGATIVE mg/dL   Protein, ur NEGATIVE  NEGATIVE mg/dL   Urobilinogen, UA 0.2  0.0 - 1.0 mg/dL   Nitrite NEGATIVE  NEGATIVE   Leukocytes, UA NEGATIVE  NEGATIVE  CBC     Status: Abnormal   Collection Time   04/01/12  5:10 PM      Component Value Range   WBC 9.2  4.0 - 10.5 K/uL   RBC 3.53 (*) 3.87 - 5.11 MIL/uL   Hemoglobin 10.8 (*) 12.0 - 15.0 g/dL   HCT 95.6 (*) 21.3 -  46.0 %   MCV 91.2  78.0 - 100.0 fL   MCH 30.6  26.0 - 34.0 pg   MCHC 33.5  30.0 - 36.0 g/dL   RDW 08.6  57.8 - 46.9 %   Platelets 261  150 - 400 K/uL  COMPREHENSIVE METABOLIC PANEL     Status: Abnormal   Collection Time   04/01/12  5:10 PM      Component Value Range   Sodium 135  135 - 145 mEq/L   Potassium 3.5  3.5 - 5.1 mEq/L   Chloride 103  96 - 112 mEq/L   CO2 22  19 - 32 mEq/L   Glucose, Bld 109 (*) 70 - 99 mg/dL   BUN 5 (*) 6 - 23 mg/dL   Creatinine, Ser 6.29 (*) 0.50 - 1.10 mg/dL   Calcium 9.0  8.4 - 52.8 mg/dL   Total Protein 5.9 (*) 6.0 - 8.3 g/dL   Albumin 2.7 (*) 3.5 - 5.2 g/dL   AST 15  0 - 37 U/L   ALT 14  0 - 35 U/L   Alkaline Phosphatase 67  39 - 117 U/L   Total Bilirubin 0.4  0.3 - 1.2 mg/dL   GFR calc non Af Amer >90  >90 mL/min   GFR calc Af Amer >90  >90 mL/min  AMYLASE     Status: Normal   Collection Time   04/01/12  5:10 PM      Component Value Range   Amylase 67  0 - 105 U/L  LIPASE, BLOOD     Status: Normal   Collection Time   04/01/12  5:10 PM      Component Value Range   Lipase 15  11 - 59 U/L  WET PREP, GENITAL     Status: Abnormal   Collection Time   04/01/12  5:35 PM      Component Value Range   Yeast Wet Prep HPF POC NONE SEEN  NONE SEEN   Trich, Wet Prep NONE SEEN  NONE SEEN   Clue Cells Wet Prep HPF POC MODERATE (*) NONE SEEN   WBC, Wet Prep HPF POC FEW (*) NONE SEEN     Assessment: 1. Musculoskeletal pain   2. Acid reflux   3. Bacterial vaginosis     Plan: Called Dr Christell Constant to discuss assessment and findings, and called again to report lab results. Discharge home PTL and fetal kick counts Drink plenty of fluids Tylenol for pain Flagyl 500 mg BID x7 days Prevacid 30mg  daily F/U with Dr Christell Constant Return to MAU as needed     Medication List     As of 04/01/2012  4:57 PM    ASK  your doctor about these medications         acetaminophen 500 MG tablet   Commonly known as: TYLENOL   Take 1,000 mg by mouth every 6 (six) hours as  needed. For pain      prenatal multivitamin Tabs   Take 1 tablet by mouth every morning.      promethazine 25 MG tablet   Commonly known as: PHENERGAN   Take 25 mg by mouth every 6 (six) hours as needed. Head aches        Sharen Counter Certified Nurse-Midwife 04/01/2012 4:57 PM

## 2012-04-01 NOTE — MAU Note (Signed)
Sharp pain across mid abd, esp when leans forward.  Started coming regular today. Feels abd tightening and the sharp pains.

## 2012-04-10 LAB — OB RESULTS CONSOLE GC/CHLAMYDIA
Chlamydia: NEGATIVE
Gonorrhea: NEGATIVE

## 2012-04-26 NOTE — MAU Provider Note (Signed)
I d/w provider and agree with above

## 2012-05-06 ENCOUNTER — Encounter (HOSPITAL_COMMUNITY): Payer: Self-pay | Admitting: *Deleted

## 2012-05-06 ENCOUNTER — Telehealth (HOSPITAL_COMMUNITY): Payer: Self-pay | Admitting: *Deleted

## 2012-05-06 NOTE — Telephone Encounter (Signed)
Preadmission screen  

## 2012-05-11 ENCOUNTER — Encounter: Payer: Self-pay | Admitting: Obstetrics & Gynecology

## 2012-05-11 ENCOUNTER — Other Ambulatory Visit: Payer: Self-pay | Admitting: Obstetrics & Gynecology

## 2012-05-11 NOTE — H&P (Signed)
Nicole Christian is a 31 y.o. female presenting for induction of labor at term [redacted]w[redacted]d and states that her labors only take 8 hours and wants to be placed right on pitocin and have her membranes ruptured. She also would like a BTL and knows this will be done PP or at the time of the CSx if needed.  The pt has been counseled regarding the increased risk of CSx with induction and would like to proceed with induction as early as possible.  She is also aware of the potential for use of intervention such as forceps and vacuum and CSx with the main risk of problems of nerve injury and marks to the face, the potential for cephalohematoma, and the risk of infection and scarring and damage to other organs, respectively.  The pt is aware of sterilization risks, benefits and alternatives.  Also that it is considered irreversible, and has a failure rate of up to 4% and also if fails the pregnancy could be an unwanted pregnancy in the uterus or a potentially lethal pregnancy in the tubes and would need to seek immediate medical attention if ever pregnant.  The pt is aware of the many alternatives of contraception both hormonal and non hormonal and implant and by mouth and patch and injection and IUDs.  She wishes to proceed   OB History    Grav Para Term Preterm Abortions TAB SAB Ect Mult Living   3 2 2       2      Past Medical History  Diagnosis Date  . Bell's palsy   . Ovarian cyst   . Sciatica   . Abnormal Pap smear     repeat was fine  . Sciatica    Past Surgical History  Procedure Date  . Vaginal delivery     X2  . No past surgeries    Family History: family history includes Cancer in her maternal grandfather; Diabetes in her mother; Heart disease in her maternal grandmother; and Hypertension in her maternal aunt and mother.  There is no history of Other. Social History:  reports that she has never smoked. She has never used smokeless tobacco. She reports that she drinks about .6 ounces of  alcohol per week. She reports that she does not use illicit drugs. Review of Systems  Constitutional: Negative.  Negative for fever, chills and weight loss.  HENT: Negative.  Negative for hearing loss and sore throat.   Eyes: Negative.  Negative for blurred vision, double vision, discharge and redness.  Respiratory: Negative.  Negative for cough, shortness of breath and wheezing.   Cardiovascular: Negative.  Negative for chest pain, palpitations, orthopnea and leg swelling.  Gastrointestinal: Negative for nausea, vomiting, diarrhea and constipation.  Genitourinary: Positive for urgency and frequency. Negative for dysuria.  Musculoskeletal: Positive for back pain. Negative for myalgias.  Skin: Negative.  Negative for rash.  Neurological: Negative.  Negative for dizziness, tingling, tremors, speech change, focal weakness, seizures and headaches.  Endo/Heme/Allergies: Negative.  Does not bruise/bleed easily.  Psychiatric/Behavioral: Negative.  Negative for depression, suicidal ideas and substance abuse.   Physical Exam  Constitutional: She is oriented to person, place, and time. She appears well-developed and well-nourished. No distress.  HENT:  Head: Normocephalic and atraumatic.  Right Ear: External ear normal.  Left Ear: External ear normal.  Mouth/Throat: Oropharynx is clear and moist. No oropharyngeal exudate.  Eyes: Conjunctivae normal and EOM are normal. Pupils are equal, round, and reactive to light. Right eye exhibits no discharge.  Left eye exhibits no discharge. No scleral icterus.  Neck: Normal range of motion. Neck supple. No tracheal deviation present. No thyromegaly present.  Cardiovascular: Normal rate and regular rhythm.   Pulmonary/Chest: Effort normal and breath sounds normal. No respiratory distress.  Abdominal: Soft.       Gravid and nontender  Genitourinary: Vagina normal.       Gravid term uterus  Musculoskeletal: Normal range of motion. She exhibits no edema and no  tenderness.  Lymphadenopathy:    She has no cervical adenopathy.  Neurological: She is alert and oriented to person, place, and time. She has normal reflexes.  Skin: Skin is warm and dry. No rash noted. She is not diaphoretic.  Psychiatric: She has a normal mood and affect. Her behavior is normal. Judgment and thought content normal.   Prenatal Transfer Tool  Maternal Diabetes: No Genetic Screening: Normal Maternal Ultrasounds/Referrals: Normal Fetal Ultrasounds or other Referrals:  None Maternal Substance Abuse:  No Significant Maternal Medications:  None Significant Maternal Lab Results: None    Pregnant   Last menstrual period 08/20/2011. Prenatal labs: ABO, Rh: --/--/O POS (05/04 1050) Antibody:   Rubella:  immune RPR:  NR  HBsAg:   negative HIV:   negative GBS: Negative (11/21 0000)  1hgtt 101 Pap 6/13 neg Hgb AA 7/13 Quad screen neg Assessment/Plan: IUP 39w0 days on 05-12-12  GERD H/o anemia Needs Tdap S/p flu vaccine on 02-12-12  AND now  Upon another thorough review of the record and found her [place] and what her previously stated LMP was 08-23-11 and thus EDC 05-29-12 and CRL in MAU seen with [facility] 05-25-12 and then subsequently told LMP was 08-13-11 for [facility] of 05-19-12 and then a 29 week [place] c/w this with an [facility] 05-16-12  I will discuss with the pt and probably need to reschedule Shiv Shuey H. 05/11/2012, 12:54 PM

## 2012-05-12 ENCOUNTER — Inpatient Hospital Stay (HOSPITAL_COMMUNITY): Payer: Medicaid Other | Admitting: Anesthesiology

## 2012-05-12 ENCOUNTER — Encounter (HOSPITAL_COMMUNITY): Payer: Self-pay | Admitting: Anesthesiology

## 2012-05-12 ENCOUNTER — Encounter (HOSPITAL_COMMUNITY): Payer: Self-pay

## 2012-05-12 ENCOUNTER — Inpatient Hospital Stay (HOSPITAL_COMMUNITY)
Admission: RE | Admit: 2012-05-12 | Discharge: 2012-05-14 | DRG: 775 | Disposition: A | Discharge status: Home / Self Care | Payer: Medicaid Other | Source: Ambulatory Visit | Attending: Obstetrics & Gynecology | Admitting: Obstetrics & Gynecology

## 2012-05-12 LAB — CBC
Platelets: 282 10*3/uL (ref 150–400)
RBC: 3.87 MIL/uL (ref 3.87–5.11)
WBC: 9.5 10*3/uL (ref 4.0–10.5)

## 2012-05-12 LAB — RPR: RPR Ser Ql: NONREACTIVE

## 2012-05-12 MED ORDER — EPHEDRINE 5 MG/ML INJ: 10.0000 mg | INTRAVENOUS | Status: DC | PRN

## 2012-05-12 MED ORDER — OXYCODONE-ACETAMINOPHEN 5-325 MG PO TABS: 1.0000 | ORAL_TABLET | ORAL | Status: DC | PRN

## 2012-05-12 MED ORDER — ONDANSETRON HCL 4 MG/2ML IJ SOLN
4.0000 mg | Freq: Four times a day (QID) | INTRAMUSCULAR | Status: DC | PRN
  Administered 2012-05-12: 4 mg via INTRAVENOUS
  Filled 2012-05-12: qty 2

## 2012-05-12 MED ORDER — PHENYLEPHRINE 40 MCG/ML (10ML) SYRINGE FOR IV PUSH (FOR BLOOD PRESSURE SUPPORT)
80.0000 ug | PREFILLED_SYRINGE | INTRAVENOUS | Status: DC | PRN
  Filled 2012-05-12: qty 5

## 2012-05-12 MED ORDER — CITRIC ACID-SODIUM CITRATE 334-500 MG/5ML PO SOLN: 30.0000 mL | ORAL | Status: DC | PRN

## 2012-05-12 MED ORDER — PHENYLEPHRINE 40 MCG/ML (10ML) SYRINGE FOR IV PUSH (FOR BLOOD PRESSURE SUPPORT): 80.0000 ug | PREFILLED_SYRINGE | INTRAVENOUS | Status: DC | PRN

## 2012-05-12 MED ORDER — HYDROXYZINE HCL 50 MG/ML IM SOLN
50.0000 mg | Freq: Four times a day (QID) | INTRAMUSCULAR | Status: DC | PRN
  Filled 2012-05-12: qty 1

## 2012-05-12 MED ORDER — LACTATED RINGERS IV SOLN
INTRAVENOUS | Status: DC
  Administered 2012-05-12 (×3): via INTRAVENOUS

## 2012-05-12 MED ORDER — BUTORPHANOL TARTRATE 1 MG/ML IJ SOLN: 1.0000 mg | INTRAMUSCULAR | Status: DC | PRN

## 2012-05-12 MED ORDER — DIPHENHYDRAMINE HCL 50 MG/ML IJ SOLN: 12.5000 mg | INTRAMUSCULAR | Status: DC | PRN

## 2012-05-12 MED ORDER — TERBUTALINE SULFATE 1 MG/ML IJ SOLN: 0.2500 mg | Freq: Once | INTRAMUSCULAR | Status: DC | PRN

## 2012-05-12 MED ORDER — ACETAMINOPHEN 325 MG PO TABS: 650.0000 mg | ORAL_TABLET | ORAL | Status: DC | PRN

## 2012-05-12 MED ORDER — EPHEDRINE 5 MG/ML INJ
10.0000 mg | INTRAVENOUS | Status: DC | PRN
  Filled 2012-05-12: qty 4

## 2012-05-12 MED ORDER — OXYTOCIN 40 UNITS IN LACTATED RINGERS INFUSION - SIMPLE MED: 62.5000 mL/h | INTRAVENOUS | Status: DC

## 2012-05-12 MED ORDER — OXYTOCIN 40 UNITS IN LACTATED RINGERS INFUSION - SIMPLE MED
1.0000 m[IU]/min | INTRAVENOUS | Status: DC
  Administered 2012-05-12: 2 m[IU]/min via INTRAVENOUS
  Filled 2012-05-12: qty 1000

## 2012-05-12 MED ORDER — LACTATED RINGERS IV SOLN: 500.0000 mL | Freq: Once | INTRAVENOUS | Status: DC

## 2012-05-12 MED ORDER — OXYTOCIN BOLUS FROM INFUSION
500.0000 mL | INTRAVENOUS | Status: DC
Start: 2012-05-12 — End: 2012-05-12
  Administered 2012-05-12: 500 mL via INTRAVENOUS

## 2012-05-12 MED ORDER — LIDOCAINE HCL (PF) 1 % IJ SOLN
30.0000 mL | INTRAMUSCULAR | Status: DC | PRN
  Filled 2012-05-12: qty 30

## 2012-05-12 MED ORDER — IBUPROFEN 600 MG PO TABS
600.0000 mg | ORAL_TABLET | Freq: Four times a day (QID) | ORAL | Status: DC | PRN
  Administered 2012-05-12: 600 mg via ORAL
  Filled 2012-05-12 (×2): qty 1

## 2012-05-12 MED ORDER — HYDROXYZINE HCL 50 MG PO TABS: 50.0000 mg | ORAL_TABLET | Freq: Four times a day (QID) | ORAL | Status: DC | PRN

## 2012-05-12 MED ORDER — FENTANYL 2.5 MCG/ML BUPIVACAINE 1/10 % EPIDURAL INFUSION (WH - ANES)
14.0000 mL/h | INTRAMUSCULAR | Status: DC
  Administered 2012-05-12: 14 mL/h via EPIDURAL
  Filled 2012-05-12: qty 125

## 2012-05-12 MED ORDER — LACTATED RINGERS IV SOLN: 500.0000 mL | INTRAVENOUS | Status: DC | PRN

## 2012-05-12 MED ORDER — LIDOCAINE HCL (PF) 1 % IJ SOLN
INTRAMUSCULAR | Status: DC | PRN
  Administered 2012-05-12 (×2): 5 mL

## 2012-05-12 NOTE — Anesthesia Procedure Notes (Signed)
Epidural Patient location during procedure: OB Start time: 05/12/2012 4:37 PM  Staffing Anesthesiologist: Brayton Caves R Performed by: anesthesiologist   Preanesthetic Checklist Completed: patient identified, site marked, surgical consent, pre-op evaluation, timeout performed, IV checked, risks and benefits discussed and monitors and equipment checked  Epidural Patient position: sitting Prep: site prepped and draped and DuraPrep Patient monitoring: continuous pulse ox and blood pressure Approach: midline Injection technique: LOR air and LOR saline  Needle:  Needle type: Tuohy  Needle gauge: 17 G Needle length: 9 cm and 9 Needle insertion depth: 5 cm cm Catheter type: closed end flexible Catheter size: 19 Gauge Catheter at skin depth: 10 cm Test dose: negative  Assessment Events: blood not aspirated, injection not painful, no injection resistance, negative IV test and no paresthesia  Additional Notes Patient identified.  Risk benefits discussed including failed block, incomplete pain control, headache, nerve damage, paralysis, blood pressure changes, nausea, vomiting, reactions to medication both toxic or allergic, and postpartum back pain.  Patient expressed understanding and wished to proceed.  All questions were answered.  Sterile technique used throughout procedure and epidural site dressed with sterile barrier dressing. No paresthesia or other complications noted.The patient did not experience any signs of intravascular injection such as tinnitus or metallic taste in mouth nor signs of intrathecal spread such as rapid motor block. Please see nursing notes for vital signs.

## 2012-05-12 NOTE — Progress Notes (Signed)
Nicole Christian is a 31 y.o. G3P2002 at [redacted]w[redacted]d  Subjective: Getting CLE as the contractions started to hurt  Objective: BP 155/53  Pulse 112  Temp 98.2 F (36.8 C) (Oral)  Ht 5\' 8"  (1.727 m)  Wt 93.441 kg (206 lb)  BMI 31.32 kg/m2  SpO2 100%  LMP 08/20/2011      FHT:  FHR: 130-140s bpm, variability: moderate,  accelerations:  Present,  decelerations:  Absent UC:   regular, every 3-5 minutes SVE:   Dilation: 2.5 Effacement (%): 70 Station: -3 Exam by:: [employer]: Lab Results  Component Value Date   WBC 9.5 05/12/2012   HGB 11.7* 05/12/2012   HCT 35.1* 05/12/2012   MCV 90.7 05/12/2012   PLT 282 05/12/2012    Assessment / Plan: IOL elective progressing, s/p AROM with clear fluid  Labor: Progressing normally on PITOCIN Preeclampsia:  na Fetal Wellbeing:  Category I Pain Control:  Epidural I/D:  n/a Anticipated MOD:  NSVD  Herndon Grill H. 05/12/2012, 4:41 PM

## 2012-05-12 NOTE — Progress Notes (Signed)
Nicole Christian is a 31 y.o. G3P2002 at [redacted]w[redacted]d  Subjective:  doing well without pain  Objective: BP 106/65  Pulse 93  Temp 98.3 F (36.8 C) (Oral)  Ht 5\' 8"  (1.727 m)  Wt 93.441 kg (206 lb)  BMI 31.32 kg/m2  LMP 08/20/2011      FHT:  FHR: 130s bpm, variability: moderate,  accelerations:  Present,  decelerations:  Absent UC:   regular, every 2-3 minutes PIT on 12 SVE:   Dilation: 2.5 Effacement (%): 70 Station: -3 Exam by:: Nicole Christian AROM with clear fluid succsessful earlier Labs: Lab Results  Component Value Date   WBC 9.5 05/12/2012   HGB 11.7* 05/12/2012   HCT 35.1* 05/12/2012   MCV 90.7 05/12/2012   PLT 282 05/12/2012    Assessment / Plan: Induction of labor due to elective at term,  progressing well on pitocin  Labor: on pitocin Preeclampsia:  na Fetal Wellbeing:  Category I Pain Control:  Epidural and planned I/D:  n/a Anticipated MOD:  NSVD  Nicole Christian H. 05/12/2012, 12:36 PM

## 2012-05-12 NOTE — Progress Notes (Signed)
Nicole Christian is a 31 y.o. G3P2002 at [redacted]w[redacted]d by the best LMP determination that she demands is 08-13-11 for [facility] 05-19-12  Subjective:  Ready and no question and wishes to proceed Objective: BP 99/59  Pulse 102  Temp 98.2 F (36.8 C) (Oral)  Ht 5\' 8"  (1.727 m)  Wt 93.441 kg (206 lb)  BMI 31.32 kg/m2  LMP 08/20/2011      FHT:  FHR: 140s bpm, variability: moderate,  accelerations:  Present,  decelerations:  Absent UC:   none SVE:   Dilation: 2.5 Effacement (%): 60 Station: -3 Exam by:: Joyel Chenette Attempted AROM but unable to get the membranes ruptured secondary to early labor and vertex ballotable Labs: Lab Results  Component Value Date   WBC 9.5 05/12/2012   HGB 11.7* 05/12/2012   HCT 35.1* 05/12/2012   MCV 90.7 05/12/2012   PLT 282 05/12/2012    Assessment / Plan: Induction of labor due to elective at term,  progressing well on pitocin  Labor: will start pitocin  Preeclampsia:  na Fetal Wellbeing:  Category I Pain Control:  Epidural I/D:  n/a Anticipated MOD:  NSVD  [name] H. 05/12/2012, 10:46 AM

## 2012-05-12 NOTE — Progress Notes (Signed)
SWANNIE MILIUS is a 31 y.o. G3P2002 at [redacted]w[redacted]d by  Subjective:  No c/o with CLE Objective: BP 117/54  Pulse 88  Temp 97.5 F (36.4 C) (Axillary)  Resp 20  Ht 5\' 8"  (1.727 m)  Wt 93.441 kg (206 lb)  BMI 31.32 kg/m2  SpO2 100%  LMP 08/20/2011   Total I/O In: -  Out: 400 [Urine:400]  FHT:  FHR: 140s bpm, variability: moderate,  accelerations:  Present,  decelerations:  Absent UC:   regular, every 2-3 minutes SVE:   Dilation: 8 Effacement (%): 90 Station: 0 Exam by:: Dr Christell Constant  Labs: Lab Results  Component Value Date   WBC 9.5 05/12/2012   HGB 11.7* 05/12/2012   HCT 35.1* 05/12/2012   MCV 90.7 05/12/2012   PLT 282 05/12/2012    Assessment / Plan: Induction of labor due to elective at term,  progressing well on pitocin  Labor: Progressing normally Preeclampsia:  na Fetal Wellbeing:  Category I Pain Control:  Epidural I/D:  n/a Anticipated MOD:  NSVD  Lakea Mittelman H. 05/12/2012, 8:48 PM

## 2012-05-12 NOTE — Anesthesia Preprocedure Evaluation (Signed)
Anesthesia Evaluation  Patient identified by MRN, date of birth, ID band Patient awake    Reviewed: Allergy & Precautions, H&P , Patient's Chart, lab work & pertinent test results  Airway Mallampati: II TM Distance: >3 FB Neck ROM: full    Dental No notable dental hx.    Pulmonary neg pulmonary ROS,  breath sounds clear to auscultation  Pulmonary exam normal       Cardiovascular negative cardio ROS  Rhythm:regular Rate:Normal     Neuro/Psych negative neurological ROS  negative psych ROS   GI/Hepatic negative GI ROS, Neg liver ROS, GERD-  Controlled,  Endo/Other  negative endocrine ROS  Renal/GU negative Renal ROS     Musculoskeletal   Abdominal   Peds  Hematology negative hematology ROS (+)   Anesthesia Other Findings Bell's palsy     Ovarian cyst        Sciatica     Abnormal Pap smear   repeat was fine    Sciatica          Reproductive/Obstetrics (+) Pregnancy                           Anesthesia Physical Anesthesia Plan  ASA: II  Anesthesia Plan: Epidural   Post-op Pain Management:    Induction:   Airway Management Planned:   Additional Equipment:   Intra-op Plan:   Post-operative Plan:   Informed Consent: I have reviewed the patients History and Physical, chart, labs and discussed the procedure including the risks, benefits and alternatives for the proposed anesthesia with the patient or authorized representative who has indicated his/her understanding and acceptance.     Plan Discussed with:   Anesthesia Plan Comments:         Anesthesia Quick Evaluation

## 2012-05-12 NOTE — Op Note (Signed)
Delivery Note At 10:00 PM a viable and healthy female was delivered via  (Presentation: LOA;  ).  APGAR:8,9 , ; weight .  pending Placenta status:delivered intact , .  Cord:  with the following complications: none.  Cord pH: na  Anesthesia: Epidural  Episiotomy: none Lacerations: none Suture Repair: Est. Blood Loss (mL): 200  Mom to postpartum.  Baby to with mom. Mom to keep CLE for BTL tomorrow [place], Nicole Christian H. 05/12/2012, 10:10 PM

## 2012-05-13 LAB — MRSA PCR SCREENING: MRSA by PCR: NEGATIVE

## 2012-05-13 LAB — CBC
HCT: 32.5 % — ABNORMAL LOW (ref 36.0–46.0)
Hemoglobin: 10.8 g/dL — ABNORMAL LOW (ref 12.0–15.0)
MCHC: 33.2 g/dL (ref 30.0–36.0)
RBC: 3.59 MIL/uL — ABNORMAL LOW (ref 3.87–5.11)
WBC: 15.2 10*3/uL — ABNORMAL HIGH (ref 4.0–10.5)

## 2012-05-13 MED ORDER — BENZOCAINE-MENTHOL 20-0.5 % EX AERO: 1.0000 "application " | INHALATION_SPRAY | CUTANEOUS | Status: DC | PRN

## 2012-05-13 MED ORDER — ONDANSETRON HCL 4 MG PO TABS: 4.0000 mg | ORAL_TABLET | ORAL | Status: DC | PRN

## 2012-05-13 MED ORDER — OXYCODONE-ACETAMINOPHEN 5-325 MG PO TABS: 1.0000 | ORAL_TABLET | ORAL | Status: DC | PRN

## 2012-05-13 MED ORDER — CEFAZOLIN SODIUM-DEXTROSE 2-3 GM-% IV SOLR: 2.0000 g | INTRAVENOUS | Status: DC

## 2012-05-13 MED ORDER — LANOLIN HYDROUS EX OINT: TOPICAL_OINTMENT | CUTANEOUS | Status: DC | PRN

## 2012-05-13 MED ORDER — SODIUM CHLORIDE 0.9 % IJ SOLN
3.0000 mL | Freq: Two times a day (BID) | INTRAMUSCULAR | Status: DC
  Administered 2012-05-13: 3 mL via INTRAVENOUS

## 2012-05-13 MED ORDER — WITCH HAZEL-GLYCERIN EX PADS
1.0000 | MEDICATED_PAD | CUTANEOUS | Status: DC | PRN
Start: 2012-05-13 — End: 2012-05-14

## 2012-05-13 MED ORDER — ONDANSETRON HCL 4 MG/2ML IJ SOLN: 4.0000 mg | INTRAMUSCULAR | Status: DC | PRN

## 2012-05-13 MED ORDER — PRENATAL MULTIVITAMIN CH
1.0000 | ORAL_TABLET | Freq: Every day | ORAL | Status: DC
  Filled 2012-05-13: qty 1

## 2012-05-13 MED ORDER — METOCLOPRAMIDE HCL 10 MG PO TABS
10.0000 mg | ORAL_TABLET | Freq: Once | ORAL | Status: AC
  Administered 2012-05-14: 10 mg via ORAL
  Filled 2012-05-13: qty 1

## 2012-05-13 MED ORDER — IBUPROFEN 600 MG PO TABS
600.0000 mg | ORAL_TABLET | Freq: Four times a day (QID) | ORAL | Status: DC
  Administered 2012-05-13 – 2012-05-14 (×5): 600 mg via ORAL
  Filled 2012-05-13 (×5): qty 1

## 2012-05-13 MED ORDER — ZOLPIDEM TARTRATE 5 MG PO TABS: 5.0000 mg | ORAL_TABLET | Freq: Every evening | ORAL | Status: DC | PRN

## 2012-05-13 MED ORDER — DIPHENHYDRAMINE HCL 25 MG PO CAPS: 25.0000 mg | ORAL_CAPSULE | Freq: Four times a day (QID) | ORAL | Status: DC | PRN

## 2012-05-13 MED ORDER — METOCLOPRAMIDE HCL 10 MG PO TABS
10.0000 mg | ORAL_TABLET | Freq: Once | ORAL | Status: AC
  Administered 2012-05-13: 10 mg via ORAL
  Filled 2012-05-13: qty 1

## 2012-05-13 MED ORDER — LACTATED RINGERS IV SOLN: INTRAVENOUS | Status: DC

## 2012-05-13 MED ORDER — TETANUS-DIPHTH-ACELL PERTUSSIS 5-2.5-18.5 LF-MCG/0.5 IM SUSP
0.5000 mL | Freq: Once | INTRAMUSCULAR | Status: AC
  Administered 2012-05-13: 0.5 mL via INTRAMUSCULAR
  Filled 2012-05-13: qty 0.5

## 2012-05-13 MED ORDER — FAMOTIDINE 20 MG PO TABS
40.0000 mg | ORAL_TABLET | Freq: Once | ORAL | Status: AC
  Administered 2012-05-13: 40 mg via ORAL
  Filled 2012-05-13: qty 2

## 2012-05-13 MED ORDER — DIBUCAINE 1 % RE OINT: 1.0000 "application " | TOPICAL_OINTMENT | RECTAL | Status: DC | PRN

## 2012-05-13 MED ORDER — SENNOSIDES-DOCUSATE SODIUM 8.6-50 MG PO TABS
2.0000 | ORAL_TABLET | Freq: Every day | ORAL | Status: DC
  Administered 2012-05-13: 2 via ORAL

## 2012-05-13 MED ORDER — SIMETHICONE 80 MG PO CHEW: 80.0000 mg | CHEWABLE_TABLET | ORAL | Status: DC | PRN

## 2012-05-13 MED ORDER — CEFAZOLIN SODIUM-DEXTROSE 2-3 GM-% IV SOLR
2.0000 g | INTRAVENOUS | Status: DC
  Filled 2012-05-13: qty 50

## 2012-05-13 MED ORDER — SODIUM CHLORIDE 0.9 % IJ SOLN: 3.0000 mL | INTRAMUSCULAR | Status: DC | PRN

## 2012-05-13 MED ORDER — SODIUM CHLORIDE 0.9 % IV SOLN: 250.0000 mL | INTRAVENOUS | Status: DC | PRN

## 2012-05-13 MED ORDER — FAMOTIDINE 20 MG PO TABS
40.0000 mg | ORAL_TABLET | Freq: Once | ORAL | Status: AC
  Administered 2012-05-14: 40 mg via ORAL
  Filled 2012-05-13: qty 2

## 2012-05-13 NOTE — Addendum Note (Signed)
Addendum  created 05/13/12 1840 by Alysson Geist A Robertt Buda, CRNA   Modules edited:Notes Section    

## 2012-05-13 NOTE — Anesthesia Postprocedure Evaluation (Signed)
Anesthesia Post Note  Patient: Nicole Christian  Procedure(s) Performed: * No procedures listed *  Anesthesia type: Epidural  Patient location: Mother/Baby  Post pain: Pain level controlled  Post assessment: Post-op Vital signs reviewed  Last Vitals:  Filed Vitals:   05/13/12 0600  BP: 107/66  Pulse: 89  Temp: 36.8 C  Resp: 16    Post vital signs: Reviewed  Level of consciousness: awake  Complications: No apparent anesthesia complications

## 2012-05-13 NOTE — Progress Notes (Signed)
UR chart review completed.  

## 2012-05-13 NOTE — Addendum Note (Signed)
Addendum  created 05/13/12 1203 by Velna Hatchet, MD   Modules edited:Notes Section

## 2012-05-13 NOTE — Addendum Note (Signed)
Addendum  created 05/13/12 1840 by Algis Greenhouse, CRNA   Modules edited:Notes Section

## 2012-05-13 NOTE — Anesthesia Postprocedure Evaluation (Signed)
Anesthesia Post Note  Patient: Nicole Christian  Procedure(s) Performed: * No procedures listed *  Anesthesia type: Epidural  Patient location: Mother/Baby  Post pain: Pain level controlled  Post assessment: Post-op Vital signs reviewed  Last Vitals:  Filed Vitals:   05/13/12 1309  BP: 109/72  Pulse: 84  Temp: 36.5 C  Resp: 18    Post vital signs: Reviewed  Level of consciousness:alert  Complications: No apparent anesthesia complications

## 2012-05-14 ENCOUNTER — Encounter (HOSPITAL_COMMUNITY)
Admission: RE | Disposition: A | Discharge status: Home / Self Care | Payer: Self-pay | Source: Ambulatory Visit | Attending: Obstetrics & Gynecology

## 2012-05-14 SURGERY — LIGATION, FALLOPIAN TUBE, POSTPARTUM
Anesthesia: Epidural | Laterality: Bilateral

## 2012-05-14 SURGICAL SUPPLY — 23 items
ADH SKN CLS APL DERMABOND .7 (GAUZE/BANDAGES/DRESSINGS)
CLOTH BEACON ORANGE TIMEOUT ST (SAFETY) ×1 IMPLANT
DERMABOND ADVANCED (GAUZE/BANDAGES/DRESSINGS)
DERMABOND ADVANCED .7 DNX12 (GAUZE/BANDAGES/DRESSINGS) ×1 IMPLANT
ELECT REM PT RETURN 9FT ADLT (ELECTROSURGICAL)
ELECTRODE REM PT RTRN 9FT ADLT (ELECTROSURGICAL) ×1 IMPLANT
GLOVE BIO SURGEON STRL SZ7.5 (GLOVE) ×1 IMPLANT
GLOVE ECLIPSE 8.0 STRL XLNG CF (GLOVE) ×1 IMPLANT
GOWN PREVENTION PLUS XLARGE (GOWN DISPOSABLE) ×1 IMPLANT
GOWN STRL REIN XL XLG (GOWN DISPOSABLE) ×1 IMPLANT
NEEDLE HYPO 22GX1.5 SAFETY (NEEDLE) ×1 IMPLANT
NS IRRIG 1000ML POUR BTL (IV SOLUTION) ×1 IMPLANT
PACK ABDOMINAL MINOR (CUSTOM PROCEDURE TRAY) ×1 IMPLANT
PENCIL BUTTON HOLSTER BLD 10FT (ELECTRODE) ×1 IMPLANT
SPONGE LAP 4X18 X RAY DECT (DISPOSABLE) ×1 IMPLANT
SUT PLAIN 2 0 (SUTURE)
SUT PLAIN ABS 2-0 54XMFL TIE (SUTURE) ×1 IMPLANT
SUT VIC AB 0 CT2 27 (SUTURE) ×2 IMPLANT
SUT VICRYL 4-0 PS2 18IN ABS (SUTURE) ×1 IMPLANT
SYR CONTROL 10ML LL (SYRINGE) ×1 IMPLANT
TOWEL OR 17X24 6PK STRL BLUE (TOWEL DISPOSABLE) ×2 IMPLANT
TRAY FOLEY CATH 14FR (SET/KITS/TRAYS/PACK) ×1 IMPLANT
WATER STERILE IRR 1000ML POUR (IV SOLUTION) ×1 IMPLANT

## 2012-05-14 NOTE — Addendum Note (Signed)
Addendum  created 05/14/12 1109 by Lincoln Brigham, CRNA   Modules edited:Anesthesia LDA, Charges VN

## 2012-05-14 NOTE — Progress Notes (Signed)
Patient ID: Nicole Christian, female   DOB: 1981-01-12, 31 y.o.   MRN: [id number] See dc summary

## 2012-05-14 NOTE — Addendum Note (Signed)
Addendum  created 05/14/12 1109 by Lincoln Brigham, CRNA   Modules edited:Charges VN

## 2012-05-14 NOTE — Discharge Summary (Signed)
Obstetric Discharge Summary Reason for Admission: induction of labor Prenatal Procedures: none Intrapartum Procedures: spontaneous vaginal delivery Postpartum Procedures: none Complications-Operative and Postpartum: none Hemoglobin  Date Value Range Status  05/13/2012 10.8* 12.0 - 15.0 g/dL Final     HCT  Date Value Range Status  05/13/2012 32.5* 36.0 - 46.0 % Final    Physical Exam:  General: alert, cooperative and no distress Lochia: appropriate Uterine Fundus: firm Incision: na DVT Evaluation: No evidence of DVT seen on physical exam. Negative Homan's sign. No cords or calf tenderness. No significant calf/ankle edema.  Discharge Diagnoses: Term Pregnancy-delivered Desired sterilization and will perform at a later date secondary to OR availability Discharge Information: Date: 05/14/2012 Activity: unrestricted Diet: routine Medications: PNV Condition: stable Instructions: refer to practice specific booklet Discharge to: home   Newborn Data: Live born female  Birth Weight: 7 lb 0.4 oz (3187 g) APGAR: 8, 9  Home with mother.  Nicole Christian H. 05/14/2012, 9:03 AM

## 2012-06-09 ENCOUNTER — Other Ambulatory Visit: Payer: Self-pay | Admitting: Obstetrics & Gynecology

## 2012-06-22 ENCOUNTER — Encounter (HOSPITAL_COMMUNITY): Payer: Self-pay | Admitting: Pharmacist

## 2012-06-24 ENCOUNTER — Encounter (HOSPITAL_COMMUNITY)
Admission: RE | Admit: 2012-06-24 | Discharge: 2012-06-24 | Disposition: A | Discharge status: Home / Self Care | Payer: 59 | Source: Ambulatory Visit | Attending: Obstetrics & Gynecology | Admitting: Obstetrics & Gynecology

## 2012-06-24 ENCOUNTER — Encounter (HOSPITAL_COMMUNITY): Payer: Self-pay

## 2012-06-24 LAB — CBC
HCT: 38.8 % (ref 36.0–46.0)
Hemoglobin: 12.8 g/dL (ref 12.0–15.0)
WBC: 5.2 10*3/uL (ref 4.0–10.5)

## 2012-06-24 LAB — SURGICAL PCR SCREEN
MRSA, PCR: NEGATIVE
Staphylococcus aureus: NEGATIVE

## 2012-06-24 NOTE — Patient Instructions (Addendum)
21 Nicole Christian  06/24/2012   Your procedure is scheduled on:  06/28/12  Enter through the Main Entrance of [facility] at 11:30 AM.  Pick up the phone at the desk and dial [phone].   Call this number if you have problems the morning of surgery: [phone]   Remember:   Do not eat food:After Midnight.  Do not drink clear liquids: 4 Hours before arrival.  Take these medicines the morning of surgery with A SIP OF WATER:    Do not wear jewelry, make-up or nail polish.  Do not wear lotions, powders, or perfumes. You may wear deodorant.  Do not shave 48 hours prior to surgery.  Do not bring valuables to the hospital.  Contacts, dentures or bridgework may not be worn into surgery.  Leave suitcase in the car. After surgery it may be brought to your room.  For patients admitted to the hospital, checkout time is 11:00 AM the day of discharge.   Patients discharged the day of surgery will not be allowed to drive home.  Name and phone number of your driver: Berkeley Veldman  Special Instructions: Shower using CHG 2 nights before surgery and the night before surgery.  If you shower the day of surgery use CHG.  Use special wash - you have one bottle of CHG for all showers.  You should use approximately 1/3 of the bottle for each shower.   Please read over the following fact sheets that you were given: MRSA Information

## 2012-06-28 ENCOUNTER — Encounter (HOSPITAL_COMMUNITY): Payer: Self-pay | Admitting: Anesthesiology

## 2012-06-28 ENCOUNTER — Ambulatory Visit (HOSPITAL_COMMUNITY): Payer: 59 | Admitting: Anesthesiology

## 2012-06-28 ENCOUNTER — Encounter (HOSPITAL_COMMUNITY): Payer: Self-pay | Admitting: *Deleted

## 2012-06-28 ENCOUNTER — Other Ambulatory Visit: Payer: Self-pay | Admitting: Obstetrics & Gynecology

## 2012-06-28 ENCOUNTER — Encounter (HOSPITAL_COMMUNITY)
Admission: RE | Disposition: A | Discharge status: Home / Self Care | Payer: Self-pay | Source: Ambulatory Visit | Attending: Obstetrics & Gynecology

## 2012-06-28 ENCOUNTER — Ambulatory Visit (HOSPITAL_COMMUNITY)
Admission: RE | Admit: 2012-06-28 | Discharge: 2012-06-28 | Disposition: A | Discharge status: Home / Self Care | Payer: 59 | Source: Ambulatory Visit | Attending: Obstetrics & Gynecology | Admitting: Obstetrics & Gynecology

## 2012-06-28 DIAGNOSIS — Z01818 Encounter for other preprocedural examination: Secondary | ICD-10-CM | POA: Insufficient documentation

## 2012-06-28 DIAGNOSIS — Z302 Encounter for sterilization: Secondary | ICD-10-CM | POA: Insufficient documentation

## 2012-06-28 DIAGNOSIS — Z01812 Encounter for preprocedural laboratory examination: Secondary | ICD-10-CM | POA: Insufficient documentation

## 2012-06-28 HISTORY — PX: LAPAROSCOPIC TUBAL LIGATION: SHX1937

## 2012-06-28 SURGERY — LIGATION, FALLOPIAN TUBE, LAPAROSCOPIC
Anesthesia: General | Site: Abdomen | Laterality: Bilateral | Wound class: Clean Contaminated

## 2012-06-28 MED ORDER — DEXAMETHASONE SODIUM PHOSPHATE 4 MG/ML IJ SOLN
INTRAMUSCULAR | Status: DC | PRN
  Administered 2012-06-28: 8 mg via INTRAVENOUS

## 2012-06-28 MED ORDER — BUPIVACAINE HCL (PF) 0.25 % IJ SOLN
INTRAMUSCULAR | Status: DC | PRN
  Administered 2012-06-28: 10 mL

## 2012-06-28 MED ORDER — ONDANSETRON HCL 4 MG/2ML IJ SOLN
INTRAMUSCULAR | Status: DC | PRN
  Administered 2012-06-28: 4 mg via INTRAVENOUS

## 2012-06-28 MED ORDER — MIDAZOLAM HCL 2 MG/2ML IJ SOLN
INTRAMUSCULAR | Status: AC
  Filled 2012-06-28: qty 2

## 2012-06-28 MED ORDER — LIDOCAINE HCL (CARDIAC) 20 MG/ML IV SOLN
INTRAVENOUS | Status: AC
  Filled 2012-06-28: qty 5

## 2012-06-28 MED ORDER — ROCURONIUM BROMIDE 100 MG/10ML IV SOLN
INTRAVENOUS | Status: DC | PRN
  Administered 2012-06-28: 40 mg via INTRAVENOUS

## 2012-06-28 MED ORDER — LIDOCAINE HCL (CARDIAC) 20 MG/ML IV SOLN
INTRAVENOUS | Status: DC | PRN
  Administered 2012-06-28: 50 mg via INTRAVENOUS

## 2012-06-28 MED ORDER — FENTANYL CITRATE 0.05 MG/ML IJ SOLN: 25.0000 ug | INTRAMUSCULAR | Status: DC | PRN

## 2012-06-28 MED ORDER — CEFAZOLIN SODIUM-DEXTROSE 2-3 GM-% IV SOLR
INTRAVENOUS | Status: AC
  Filled 2012-06-28: qty 50

## 2012-06-28 MED ORDER — GLYCOPYRROLATE 0.2 MG/ML IJ SOLN
INTRAMUSCULAR | Status: AC
  Filled 2012-06-28: qty 2

## 2012-06-28 MED ORDER — GLYCOPYRROLATE 0.2 MG/ML IJ SOLN
INTRAMUSCULAR | Status: AC
  Filled 2012-06-28: qty 1

## 2012-06-28 MED ORDER — PROPOFOL 10 MG/ML IV EMUL
INTRAVENOUS | Status: DC | PRN
  Administered 2012-06-28: 200 mg via INTRAVENOUS

## 2012-06-28 MED ORDER — MIDAZOLAM HCL 5 MG/5ML IJ SOLN
INTRAMUSCULAR | Status: DC | PRN
  Administered 2012-06-28: 2 mg via INTRAVENOUS

## 2012-06-28 MED ORDER — NEOSTIGMINE METHYLSULFATE 1 MG/ML IJ SOLN
INTRAMUSCULAR | Status: DC | PRN
  Administered 2012-06-28: 3 mg via INTRAVENOUS

## 2012-06-28 MED ORDER — FENTANYL CITRATE 0.05 MG/ML IJ SOLN
INTRAMUSCULAR | Status: DC | PRN
  Administered 2012-06-28 (×2): 100 ug via INTRAVENOUS
  Administered 2012-06-28: 50 ug via INTRAVENOUS

## 2012-06-28 MED ORDER — NEOSTIGMINE METHYLSULFATE 1 MG/ML IJ SOLN
INTRAMUSCULAR | Status: AC
  Filled 2012-06-28: qty 1

## 2012-06-28 MED ORDER — ONDANSETRON HCL 4 MG/2ML IJ SOLN: 4.0000 mg | Freq: Once | INTRAMUSCULAR | Status: DC | PRN

## 2012-06-28 MED ORDER — MEPERIDINE HCL 25 MG/ML IJ SOLN: 6.2500 mg | INTRAMUSCULAR | Status: DC | PRN

## 2012-06-28 MED ORDER — LACTATED RINGERS IV SOLN
INTRAVENOUS | Status: DC
  Administered 2012-06-28 (×3): via INTRAVENOUS

## 2012-06-28 MED ORDER — CEFAZOLIN SODIUM-DEXTROSE 2-3 GM-% IV SOLR
2.0000 g | INTRAVENOUS | Status: AC
  Administered 2012-06-28: 2 g via INTRAVENOUS

## 2012-06-28 MED ORDER — KETOROLAC TROMETHAMINE 30 MG/ML IJ SOLN
INTRAMUSCULAR | Status: AC
  Filled 2012-06-28: qty 1

## 2012-06-28 MED ORDER — GLYCOPYRROLATE 0.2 MG/ML IJ SOLN
INTRAMUSCULAR | Status: DC | PRN
  Administered 2012-06-28: 0.6 mg via INTRAVENOUS

## 2012-06-28 MED ORDER — KETOROLAC TROMETHAMINE 30 MG/ML IJ SOLN
INTRAMUSCULAR | Status: DC | PRN
  Administered 2012-06-28: 30 mg via INTRAVENOUS

## 2012-06-28 MED ORDER — BUPIVACAINE HCL (PF) 0.25 % IJ SOLN
INTRAMUSCULAR | Status: AC
  Filled 2012-06-28: qty 30

## 2012-06-28 MED ORDER — FENTANYL CITRATE 0.05 MG/ML IJ SOLN
INTRAMUSCULAR | Status: AC
  Filled 2012-06-28: qty 5

## 2012-06-28 MED ORDER — KETOROLAC TROMETHAMINE 30 MG/ML IJ SOLN: 15.0000 mg | Freq: Once | INTRAMUSCULAR | Status: DC | PRN

## 2012-06-28 SURGICAL SUPPLY — 18 items
CATH ROBINSON RED A/P 16FR (CATHETERS) ×2 IMPLANT
CLOTH BEACON ORANGE TIMEOUT ST (SAFETY) ×2 IMPLANT
DRSG COVADERM PLUS 2X2 (GAUZE/BANDAGES/DRESSINGS) ×1 IMPLANT
GLOVE BIOGEL PI IND STRL 8 (GLOVE) ×2 IMPLANT
GLOVE BIOGEL PI INDICATOR 8 (GLOVE) ×2
GLOVE ECLIPSE 8.0 STRL XLNG CF (GLOVE) ×2 IMPLANT
GOWN PREVENTION PLUS LG XLONG (DISPOSABLE) ×2 IMPLANT
GOWN STRL NON-REIN LRG LVL3 (GOWN DISPOSABLE) ×2 IMPLANT
PACK LAPAROSCOPY BASIN (CUSTOM PROCEDURE TRAY) ×2 IMPLANT
SOLUTION ELECTROLUBE (MISCELLANEOUS) ×1 IMPLANT
SPONGE GAUZE 2X2 8PLY STRL LF (GAUZE/BANDAGES/DRESSINGS) ×1 IMPLANT
SUT VICRYL 0 UR6 27IN ABS (SUTURE) ×2 IMPLANT
SUT VICRYL RAPIDE 3 0 (SUTURE) ×1 IMPLANT
TOWEL OR 17X24 6PK STRL BLUE (TOWEL DISPOSABLE) ×4 IMPLANT
TROCAR BALLN 12MMX100 BLUNT (TROCAR) ×1 IMPLANT
TROCAR Z-THREAD BLADED 11X100M (TROCAR) ×2 IMPLANT
WARMER LAPAROSCOPE (MISCELLANEOUS) ×2 IMPLANT
WATER STERILE IRR 1000ML POUR (IV SOLUTION) ×2 IMPLANT

## 2012-06-28 NOTE — Interval H&P Note (Signed)
History and Physical Interval Note:  06/28/2012 12:52 PM  Nicole Christian  has presented today for surgery, with the diagnosis of desires sterilization   The various methods of treatment have been discussed with the patient and family. After consideration of risks, benefits and other options for treatment, the patient has consented to  Procedure(s) (LRB) with comments: LAPAROSCOPIC TUBAL LIGATION (Bilateral) as a surgical intervention .  The patient's history has been reviewed, patient examined, no change in status, stable for surgery.  I have reviewed the patient's chart and labs.  Questions were answered to the patient's satisfaction.     Chalon Zobrist H.

## 2012-06-28 NOTE — H&P (Signed)
--------------------------------------------------------------------------------  Nicole Christian  45 Y  old  Female, DOB: August 11, 1980  [address] , [place], [id number]  Home: [phone]      Guarantor: Nicole Christian    Insurance: MEDICAID  Referring: Delbert Harness  Appointment Facility: [facility]   -------------------------------------------------------------------------------- 06/28/2012 Waverly Ferrari. Christell Constant, MD    Reason for Appointment  1. Pre-operative for tubal      History of Present Illness  here for:          32 year old female presents with c/o here for preoperative visit for laparoscopic interval tubal ligation.,   The pt has a depression score of 25 on the screening consistent with severe. She thinks she wants to start medicine and states she just feels overwhelmed. We made sure she wanted to proceed with the surgery.   She would like to start medicine and we talked about counseling and she would like to proceed for now with medicine.  she knows to call for any worsening of the issues or she changes and would want to harm herself or someone else and she has some support at home.                           Surgery counseling: The patient is aware that the risks specific to the procedure are that there is a potential for bleeding and the possibility of a transfusion. She realizes that this procedure will make her sterile. There is a potential for damage to other intra-abdominal organs to include ovaries, tubes, bladder, ureters, and bowel, sometimes recognized at the time of surgery and repaired at that time with a potential for a larger incision and/or a second surgeon. The patient is also aware of the fact that these may not be discovered until later date and that she would need to let us know about any postoperative complications by calling me, the office or proceeding to the emergency room. If these complications are not found at the time of  surgery and are discovered later date they may need surgical repair at that time. The patient is also aware the potential for infection and the potential for prolonged hospital stay and the potential for antibiotics on an inpatient and/or outpatient basis. The patient is aware that pain may may be worsened or new after the surgery. This could be because of scar tissue or the result of healing post procedure or the result that the pain is related to another potential etiology. She understands the above and wishes to proceed with the procedure . The patient is also aware that we will attempt of procedure with minimally invasive technique. However, the patient also understands that if for some reason there is a judgment that would occur during the procedure to convert this to a open procedure or a laparotomy this would have to be done. Transfusion counseling: The patient is aware of the possibility with this procedure of the use of blood replacement products in the form of a transfusion. The patient is aware of the risks benefits and alternatives of these. She is aware that we would only use these on an emergency basis or sometimes if considered to significantly reduce the risk of transfusion requirement during surgery. The patient is aware of the risk of the each unit of transfusion having a risk of HIV being approximately 1 in 1 million and having a risk of hepatitis being 1 in 2000, with sometimes the development of chronic  active hepatitis bleeding to the potential for liver failure and liver transplant, and having a low substantial risk of transfusion reaction were her body would reject the replacement when we thought she needed it. The patient understands them wishes to proceed. The patient is aware of the risks, benefits and alternatives of the procedure. She is also aware that the risks as above and also that it fails to make her sterile at a rate up to 40/1000 women years and that if she is ever pregnant she  would need to seek immediate medical attention because the pregnancy could be an ectopic which is potentially lethal. She is aware that the pregnancy could be unwanted in the uterus, but if elsewhere she would potentially need emergency surgery because it could be lethal. She understands this procedure is lethal..     Current Medications  None        Past Medical History  No Medical History.      Surgical History  Denies Past Surgical History      Family History  Father: alive   Mother: alive DM   Paternal Grand Father: deceased   Paternal Grand Mother: alive   Maternal Grand Father: alive   Maternal Grand Mother: deceased heart attack, HTN   1 son(s) , 2 daughter(s) - healthy.       Social History  Tobacco Use:         Tobacco Use/Smoking            Are you a  nonsmoker       Tobacco use other than smoking            Are you an other tobacco user?  No Drugs/Alcohol:         Drugs            Have you used drugs other than those for medical reasons in the past 12 months?  No       Alcohol Screen            Did you have a drink containing alcohol in the past year?  Yes          How often did you have a drink containing alcohol in the past year?  2 to 4 times a month (2 points)          How many drinks did you have on a typical day when you were drinking in the past year?  1 or 2 drinks (0 point)          How often did you have 6 or more drinks on one occasion in the past year?  Never (0 point)          Points  2          Interpretation  Negative Miscellaneous:         Caffeine: yes, frequency:, 1-2 cups per day.        Seat belt use: Yes.        Children: yes.        no Community involvements.        no Domestic violence.        no Exercise.        Home smoke detector use: yes.        no Legal problems.        Marital status: single.        no Natural support system.        New since last visit: none.  Occupation: weeks/months/years, works full-time.         Occupational exposure: none.        Others at home: none.        Pets: none.        no Sexual abuse.        no Sexually active.        no Travel outside of the [place].        no Verbal abuse.     Gyn History  Periods :  pregnant.   Sexual activity  currently sexually active, with men.   Last pap smear date  2013 negative [place] .   Last mammogram date  n/a.   Abnormal pap smear  yes repap negative.   Date of Last Period  08/13/2011.   Sexually Transmitted Diseases (STDs)  none.      OB History  Total pregnancies  3.   Total living children  2.   Pregnancy # 1:  normal spontaneous vaginal delivery (NSVD), no complications.   Pregnancy # 2:  normal spontaneous vaginal delivery (NSVD), no complications.   Pregnancy # 3  normal spontaneous vaginal delivery (NSVD), no complications, 39 weeks, female, 7 lbs.      Allergies  N.K.D.A.      Hospitalization/Major Diagnostic Procedure  child births       Review of Systems  General/Constitutional:          Admits Change in appetite, admits patient states that it has decreased .  Denies Chills.  Denies Fatigue.  Denies Fever.  Denies Headache.  Denies Lightheadedness.      Allergy/Immunology:          Denies Blistering of skin.  Denies Congestion.  Denies Cough.  Denies Hives.  Denies Itching.  Denies Rash.  Denies Sneezing.  Denies Watery eyes.  Denies Wheezing.      Ophthalmologic:          Denies Discharge.  Denies Dry eye.      ENT:          Denies Dry mouth.  Denies Ear pain.      Endocrine:          Denies Dizziness.      Breast:          Denies Bloody nipple discharge.  Denies Breast lump.  Denies Breast pain.  Denies Breast swelling.  Denies Fever.  Denies Gland swelling.  Denies Nipple discharge.  Denies Red skin.  Denies Weight loss.      Cardiovascular:          Denies Dizziness.      Gastrointestinal:          Denies Abdominal pain.  Denies Blood in stool.  Denies Constipation.  Denies Diarrhea.  Denies  Heartburn.  Denies Nausea.  Denies Vomiting.      Women Only:          Denies Breast lump.  Denies Breast pain.  Admits Irregular menses, admits patient states that it lasted 2-3 weeks and that there was clotting involved to the point that before she could sit on tiolet that the clots would hit the floor .  Denies Painful intercourse.  Denies Vaginal discharge/itching.      Genitourinary:          Denies Abdominal pain/swelling.  Denies Blood in urine.  Denies Difficulty urinating.  Denies Frequent urination.  Denies Pain in lower back.  Denies Painful urination.      Neurologic:  Denies Dizziness.  Denies Fainting.  Denies Headache.      Psychiatric:          Admits Anxiety, admits that she is having issues with anxiety .  Admits Depressed mood, admits.          Vital Signs  HR 74 /min, BP 120/76 mm Hg, Ht 68 in, Wt 184 lbs, BMI 27.97 Index, RR 16 /min, Ht-cm 172.72 cm, Wt-kg 83.46 kg.    Examination  General Examination:        GENERAL APPEARANCE: in no acute distress, well developed, well nourished.          HEAD: normocephalic, atraumatic.          EYES: normal.          EARS: normal.          ORAL CAVITY: mucosa moist.          THROAT: clear.          NECK/THYROID: neck supple, full range of motion, no cervical lymphadenopathy.          LYMPH NODES: normal, no axillary, supraclavicular or inguinal adenopathy.          SKIN: no suspicious lesions, warm and dry.          HEART: no murmurs, regular rate and rhythm, S1, S2 normal.          LUNGS: clear to auscultation bilaterally.          BREASTS: not examined.          ABDOMEN: normal, bowel sounds present, soft, nontender, nondistended.          BACK: no costovertebral angle tenderness.          FEMALE GENITOURINARY: chaperone in room exam deferred for now as pt was seen recently and would not change our plan as she is having no symptoms.          EXTREMITIES: no clubbing, cyanosis, or edema.          NEUROLOGIC: nonfocal,  motor strength normal upper and lower extremities, sensory exam intact.          PATIENT DID DEPRESSION SCALE 25.     Assessments  1. General counseling for initiation of other contraceptive measures - V25.02 (Primary)  2. Sterilization - V25.2  3. Depressive disorder, not elsewhere classified - 311  4. Screening for depression - V79.0    Treatment  1. Sterilization   Start Percocet Tablet, 5-325 MG, 1 tablet as needed, Orally, every 6 hrs, 7 days, 20, Refills 0      LAB: Pregnancy Test, Urine, [facility] proceed with laparoscopic interval sterilization.      2. Depressive disorder, not elsewhere classified   Start Zoloft Tablet, 50 MG, 1 tablet, Orally, Once a day, 30 day(s), 30, Refills 1

## 2012-06-28 NOTE — Anesthesia Postprocedure Evaluation (Signed)
  Anesthesia Post-op Note  Patient: Nicole Christian  Procedure(s) Performed: Procedure(s) (LRB) with comments: LAPAROSCOPIC TUBAL LIGATION (Bilateral)  Patient Location: PACU  Anesthesia Type:General  Level of Consciousness: awake, alert  and oriented  Airway and Oxygen Therapy: Patient Spontanous Breathing  Post-op Pain: mild  Post-op Assessment: Post-op Vital signs reviewed, Patient's Cardiovascular Status Stable, Respiratory Function Stable, Patent Airway, No signs of Nausea or vomiting and Pain level controlled  Post-op Vital Signs: Reviewed and stable  Complications: No apparent anesthesia complications

## 2012-06-28 NOTE — Op Note (Signed)
06/28/2012 @1504   PATIENT:Nicole Christian  PRE-OPERATIVE DIAGNOSIS: Desired Sterilization POST-OPERATIVE DIAGNOSIS: same PROCEDURE: Procedure(s): 1. LAPAROSCOPIC TUBAL LIGATION   SURGEON: Surgeon(s):  Delbert Harness, MD    ANESTHESIA: local 10 mL and general   EBL: 25 mL  IVF UOP 5o ml at the start of the case     BLOOD ADMINISTERED:none   DRAINS:none   LOCAL MEDICATIONS USED: MARCAINE   SPECIMEN:none   COUNTS: YES   TOURNIQUET: * No tourniquets in log *   DICTATION: Epic note  PLAN OF CARE: Discharge   PATIENT DISPOSITION: PACU - hemodynamically stable.   INDICATIONS:Nicole Christian is a 32yo [id number]  And desires permanent sterilization for her contraception and was counseled regarding the risks, benefits and alternatives of the procedure and wished to proceed.   FINDINGS:  Laparoscopy revealed normal upper abdomen normal ovaries bilaterally normal tubes bilaterally normal uterine fundus.  Both tubes were transected and desiccated at the transection site and also approximately 3 cm proximal 4 cm distal.  One small <1 cm of a myoma on the anterior fundus noted.   DESCRIPTION:  The patient was identified in the preoperative holding area.. The consent was signed and the patient had her mechanical DVT prophylaxis and  her IV antibiotics were initiated in the OR.   Once in the operating room the patient was placed in the supine position and general anesthesia was administered.. Patient was then placed the modified lithotomy position with use of modified bucket stirrups. Patient was then prepped and draped in usual sterile fashion. Patient then underwent active time out to match patient with procedure.   A sponge stick was placed in the vagina. The bladder was drained with a straight drain.   The laparoscopic portion she began with a 2 cm of infra umbilical skin incision.   Subcutaneous tissue was retracted bluntly until the anterior fascial sheath was  identified. This was tented up with 2 Kocher clamps. This was divided in the midline vertically. 2 UR 6 0 Vicryl stay sutures were then placed. The peritoneum was bluntly entered this point with the S. retractor. Visualization of intra-abdominal contents was noted to confirm intra-abdominal placement. The [facility] sheath was then placed and secured to the stay sutures. The pneumoperitoneum was created with CO2 gas was no greater than 15 mm mercury positive intra-abdominal pressure noted throughout the entire procedure without alarm.   The operating scope was used to admit the Kleppinger bipolar paddles into the abdomen where approximately a 7 cm of of tube was completely desiccated confirmed by audible amp meter and visualization. The tube was then transected the midportion. The cut ends were then confirmed to be desiccated. This was performed on both sides.  A second Kleppinger was brougth to the room when the first malfunctioned.  Care was taken to make sure that at no time was the energy applied near other tissue by lifting the tube away from other structures.   The previously placed 0 Vicryl stay sutures and used to reapproximate the fascia. Fascial integrity was noted to require on e more tie of a 0 vicryl.  The wound was bleeding and mostly controlled with the Bipolar but it was at the skin level and the subcutaneoius tissue was closed as well to reduce dead space.  The skin was closed with 3.0 vicryl rapide. .   Local half percent Marcaine was then injected lateral to the wound total of 10 ml.   A pressure dressing was placed. Marland Kitchen  The patient was replaced supine reversal general anesthesia and taken in awake and stable condition.The sponge stick was removed from the vagina prior to transport.

## 2012-06-28 NOTE — Anesthesia Preprocedure Evaluation (Addendum)
Anesthesia Evaluation  Patient identified by MRN, date of birth, ID band Patient awake    Reviewed: Allergy & Precautions, H&P , NPO status , Patient's Chart, lab work & pertinent test results  Airway Mallampati: I TM Distance: >3 FB Neck ROM: full    Dental No notable dental hx. (+) Teeth Intact   Pulmonary neg pulmonary ROS,    Pulmonary exam normal       Cardiovascular negative cardio ROS      Neuro/Psych negative psych ROS   GI/Hepatic negative GI ROS, Neg liver ROS,   Endo/Other  negative endocrine ROS  Renal/GU negative Renal ROS  negative genitourinary   Musculoskeletal negative musculoskeletal ROS (+)   Abdominal Normal abdominal exam  (+)   Peds negative pediatric ROS (+)  Hematology negative hematology ROS (+)   Anesthesia Other Findings   Reproductive/Obstetrics negative OB ROS                          Anesthesia Physical Anesthesia Plan  ASA: I  Anesthesia Plan: General   Post-op Pain Management:    Induction: Intravenous  Airway Management Planned: Oral ETT  Additional Equipment:   Intra-op Plan:   Post-operative Plan: Extubation in OR  Informed Consent: I have reviewed the patients History and Physical, chart, labs and discussed the procedure including the risks, benefits and alternatives for the proposed anesthesia with the patient or authorized representative who has indicated his/her understanding and acceptance.   Dental Advisory Given  Plan Discussed with: CRNA and Surgeon  Anesthesia Plan Comments:        Anesthesia Quick Evaluation

## 2012-06-28 NOTE — H&P (View-Only) (Signed)
--------------------------------------------------------------------------------  Christian, Nicole  32 Y  old  Female, DOB: 08/29/1980  [address] , [place], [id number]  Home: [phone]      Guarantor: Esperanza, Sharifah    Insurance: MEDICAID  Referring: Alayiah Fontes H Berman Grainger  Appointment Facility: [facility]   -------------------------------------------------------------------------------- 06/28/2012 Nicole Christian H. Kayela Humphres, MD    Reason for Appointment  1. Pre-operative for tubal      History of Present Illness  here for:          32 year old female presents with c/o here for preoperative visit for laparoscopic interval tubal ligation.,   The pt has a depression score of 25 on the screening consistent with severe. She thinks she wants to start medicine and states she just feels overwhelmed. We made sure she wanted to proceed with the surgery.   She would like to start medicine and we talked about counseling and she would like to proceed for now with medicine.  she knows to call for any worsening of the issues or she changes and would want to harm herself or someone else and she has some support at home.                           Surgery counseling: The patient is aware that the risks specific to the procedure are that there is a potential for bleeding and the possibility of a transfusion. She realizes that this procedure will make her sterile. There is a potential for damage to other intra-abdominal organs to include ovaries, tubes, bladder, ureters, and bowel, sometimes recognized at the time of surgery and repaired at that time with a potential for a larger incision and/or a second surgeon. The patient is also aware of the fact that these may not be discovered until later date and that she would need to let us know about any postoperative complications by calling me, the office or proceeding to the emergency room. If these complications are not found at the time of  surgery and are discovered later date they may need surgical repair at that time. The patient is also aware the potential for infection and the potential for prolonged hospital stay and the potential for antibiotics on an inpatient and/or outpatient basis. The patient is aware that pain may may be worsened or new after the surgery. This could be because of scar tissue or the result of healing post procedure or the result that the pain is related to another potential etiology. She understands the above and wishes to proceed with the procedure . The patient is also aware that we will attempt of procedure with minimally invasive technique. However, the patient also understands that if for some reason there is a judgment that would occur during the procedure to convert this to a open procedure or a laparotomy this would have to be done. Transfusion counseling: The patient is aware of the possibility with this procedure of the use of blood replacement products in the form of a transfusion. The patient is aware of the risks benefits and alternatives of these. She is aware that we would only use these on an emergency basis or sometimes if considered to significantly reduce the risk of transfusion requirement during surgery. The patient is aware of the risk of the each unit of transfusion having a risk of HIV being approximately 1 in 1 million and having a risk of hepatitis being 1 in 2000, with sometimes the development of chronic   active hepatitis bleeding to the potential for liver failure and liver transplant, and having a low substantial risk of transfusion reaction were her body would reject the replacement when we thought she needed it. The patient understands them wishes to proceed. The patient is aware of the risks, benefits and alternatives of the procedure. She is also aware that the risks as above and also that it fails to make her sterile at a rate up to 40/1000 women years and that if she is ever pregnant she  would need to seek immediate medical attention because the pregnancy could be an ectopic which is potentially lethal. She is aware that the pregnancy could be unwanted in the uterus, but if elsewhere she would potentially need emergency surgery because it could be lethal. She understands this procedure is lethal..     Current Medications  None        Past Medical History  No Medical History.      Surgical History  Denies Past Surgical History      Family History  Father: alive   Mother: alive DM   Paternal Grand Father: deceased   Paternal Grand Mother: alive   Maternal Grand Father: alive   Maternal Grand Mother: deceased heart attack, HTN   1 son(s) , 2 daughter(s) - healthy.       Social History  Tobacco Use:         Tobacco Use/Smoking            Are you a  nonsmoker       Tobacco use other than smoking            Are you an other tobacco user?  No Drugs/Alcohol:         Drugs            Have you used drugs other than those for medical reasons in the past 12 months?  No       Alcohol Screen            Did you have a drink containing alcohol in the past year?  Yes          How often did you have a drink containing alcohol in the past year?  2 to 4 times a month (2 points)          How many drinks did you have on a typical day when you were drinking in the past year?  1 or 2 drinks (0 point)          How often did you have 6 or more drinks on one occasion in the past year?  Never (0 point)          Points  2          Interpretation  Negative Miscellaneous:         Caffeine: yes, frequency:, 1-2 cups per day.        Seat belt use: Yes.        Children: yes.        no Community involvements.        no Domestic violence.        no Exercise.        Home smoke detector use: yes.        no Legal problems.        Marital status: single.        no Natural support system.        New since last visit: none.          Occupation: weeks/months/years, works full-time.         Occupational exposure: none.        Others at home: none.        Pets: none.        no Sexual abuse.        no Sexually active.        no Travel outside of the [place].        no Verbal abuse.     Gyn History  Periods :  pregnant.   Sexual activity  currently sexually active, with men.   Last pap smear date  2013 negative [facility] .   Last mammogram date  n/a.   Abnormal pap smear  yes repap negative.   Date of Last Period  08/13/2011.   Sexually Transmitted Diseases (STDs)  none.      OB History  Total pregnancies  3.   Total living children  2.   Pregnancy # 1:  normal spontaneous vaginal delivery (NSVD), no complications.   Pregnancy # 2:  normal spontaneous vaginal delivery (NSVD), no complications.   Pregnancy # 3  normal spontaneous vaginal delivery (NSVD), no complications, 39 weeks, female, 7 lbs.      Allergies  N.K.D.A.      Hospitalization/Major Diagnostic Procedure  child births       Review of Systems  General/Constitutional:          Admits Change in appetite, admits patient states that it has decreased .  Denies Chills.  Denies Fatigue.  Denies Fever.  Denies Headache.  Denies Lightheadedness.      Allergy/Immunology:          Denies Blistering of skin.  Denies Congestion.  Denies Cough.  Denies Hives.  Denies Itching.  Denies Rash.  Denies Sneezing.  Denies Watery eyes.  Denies Wheezing.      Ophthalmologic:          Denies Discharge.  Denies Dry eye.      ENT:          Denies Dry mouth.  Denies Ear pain.      Endocrine:          Denies Dizziness.      Breast:          Denies Bloody nipple discharge.  Denies Breast lump.  Denies Breast pain.  Denies Breast swelling.  Denies Fever.  Denies Gland swelling.  Denies Nipple discharge.  Denies Red skin.  Denies Weight loss.      Cardiovascular:          Denies Dizziness.      Gastrointestinal:          Denies Abdominal pain.  Denies Blood in stool.  Denies Constipation.  Denies Diarrhea.  Denies  Heartburn.  Denies Nausea.  Denies Vomiting.      Women Only:          Denies Breast lump.  Denies Breast pain.  Admits Irregular menses, admits patient states that it lasted 2-3 weeks and that there was clotting involved to the point that before she could sit on tiolet that the clots would hit the floor .  Denies Painful intercourse.  Denies Vaginal discharge/itching.      Genitourinary:          Denies Abdominal pain/swelling.  Denies Blood in urine.  Denies Difficulty urinating.  Denies Frequent urination.  Denies Pain in lower back.  Denies Painful urination.      Neurologic:            Denies Dizziness.  Denies Fainting.  Denies Headache.      Psychiatric:          Admits Anxiety, admits that she is having issues with anxiety .  Admits Depressed mood, admits.          Vital Signs  HR 74 /min, BP 120/76 mm Hg, Ht 68 in, Wt 184 lbs, BMI 27.97 Index, RR 16 /min, Ht-cm 172.72 cm, Wt-kg 83.46 kg.    Examination  General Examination:        GENERAL APPEARANCE: in no acute distress, well developed, well nourished.          HEAD: normocephalic, atraumatic.          EYES: normal.          EARS: normal.          ORAL CAVITY: mucosa moist.          THROAT: clear.          NECK/THYROID: neck supple, full range of motion, no cervical lymphadenopathy.          LYMPH NODES: normal, no axillary, supraclavicular or inguinal adenopathy.          SKIN: no suspicious lesions, warm and dry.          HEART: no murmurs, regular rate and rhythm, S1, S2 normal.          LUNGS: clear to auscultation bilaterally.          BREASTS: not examined.          ABDOMEN: normal, bowel sounds present, soft, nontender, nondistended.          BACK: no costovertebral angle tenderness.          FEMALE GENITOURINARY: chaperone in room exam deferred for now as pt was seen recently and would not change our plan as she is having no symptoms.          EXTREMITIES: no clubbing, cyanosis, or edema.          NEUROLOGIC: nonfocal,  motor strength normal upper and lower extremities, sensory exam intact.          PATIENT DID DEPRESSION SCALE 25.     Assessments  1. General counseling for initiation of other contraceptive measures - V25.02 (Primary)  2. Sterilization - V25.2  3. Depressive disorder, not elsewhere classified - 311  4. Screening for depression - V79.0    Treatment  1. Sterilization   Start Percocet Tablet, 5-325 MG, 1 tablet as needed, Orally, every 6 hrs, 7 days, 20, Refills 0      LAB: Pregnancy Test, Urine, [facility] proceed with laparoscopic interval sterilization.      2. Depressive disorder, not elsewhere classified   Start Zoloft Tablet, 50 MG, 1 tablet, Orally, Once a day, 30 day(s), 30, Refills 1                    

## 2012-06-28 NOTE — Transfer of Care (Signed)
Immediate Anesthesia Transfer of Care Note  Patient: Nicole Christian  Procedure(s) Performed: Procedure(s) (LRB) with comments: LAPAROSCOPIC TUBAL LIGATION (Bilateral)  Patient Location: PACU  Anesthesia Type:General  Level of Consciousness: awake, alert  and oriented  Airway & Oxygen Therapy: Patient Spontanous Breathing and Patient connected to nasal cannula oxygen  Post-op Assessment: Report given to PACU RN and Post -op Vital signs reviewed and stable  Post vital signs: Reviewed and stable  Complications: No apparent anesthesia complications

## 2012-06-29 ENCOUNTER — Encounter (HOSPITAL_COMMUNITY): Payer: Self-pay | Admitting: Obstetrics & Gynecology

## 2014-04-02 ENCOUNTER — Encounter (HOSPITAL_COMMUNITY): Payer: Self-pay | Admitting: Obstetrics & Gynecology

## 2014-06-14 ENCOUNTER — Encounter (HOSPITAL_COMMUNITY): Payer: Self-pay | Admitting: Obstetrics & Gynecology

## 2014-11-08 ENCOUNTER — Encounter (HOSPITAL_COMMUNITY): Payer: Self-pay | Admitting: Obstetrics & Gynecology

## 2016-11-12 ENCOUNTER — Ambulatory Visit: Payer: Medicaid Other | Attending: Family Medicine | Admitting: Family Medicine

## 2016-11-12 VITALS — BP 107/70 | HR 63 | Temp 98.2°F | Resp 18 | Ht 68.0 in | Wt 200.8 lb

## 2016-11-12 DIAGNOSIS — Z0000 Encounter for general adult medical examination without abnormal findings: Secondary | ICD-10-CM | POA: Insufficient documentation

## 2016-11-12 DIAGNOSIS — M2012 Hallux valgus (acquired), left foot: Secondary | ICD-10-CM | POA: Diagnosis not present

## 2016-11-12 DIAGNOSIS — M2011 Hallux valgus (acquired), right foot: Secondary | ICD-10-CM | POA: Diagnosis not present

## 2016-11-12 DIAGNOSIS — K219 Gastro-esophageal reflux disease without esophagitis: Secondary | ICD-10-CM | POA: Diagnosis not present

## 2016-11-12 DIAGNOSIS — M25579 Pain in unspecified ankle and joints of unspecified foot: Secondary | ICD-10-CM

## 2016-11-12 MED ORDER — IBUPROFEN 600 MG PO TABS: 600.0000 mg | ORAL_TABLET | Freq: Three times a day (TID) | ORAL | 0 refills | Status: DC | PRN

## 2016-11-12 MED ORDER — RANITIDINE HCL 150 MG PO TABS: 150.0000 mg | ORAL_TABLET | Freq: Two times a day (BID) | ORAL | Status: DC | PRN

## 2016-11-12 NOTE — Progress Notes (Signed)
Patient is here for general check up  Patient wants to be screen for PAP in 2 weeks  Patient has foot issues

## 2016-11-12 NOTE — Patient Instructions (Signed)

## 2016-11-12 NOTE — Progress Notes (Signed)
Subjective:   Patient ID: Nicole Christian, female    DOB: January 01, 1981, 36 y.o.   MRN: [id number]  Chief Complaint  Patient presents with  . Annual Exam   HPI Nicole Christian 36 y.o. female presents annual physical examination. She denies any CP, SOB, wheezing, palpitations, or near syncope. She has complaints of bilateral foot pain for 6 months. Symptoms include tenderness and aching. Pain 9/10 . Denies any history of injury. Reports taking OTC tylenol , ibuprofen, massage. No fracture or foot injury. She does reports occasional heartburn. Denies dysphagia, cough, abdominal pain, bowel or bladder changes. Denies symptoms of all other pertinent systems. Family history of DM- mother, HTN-maternal aunts, maternal GM-heart attack and Cancer- maternal grandfather-lymphnodes. She is a non smoker. She drinks alcohol drinker socially. Denies daily use. Occupational- Electronics engineer studying social work.   Past Medical History:  Diagnosis Date  . Abnormal Pap smear    repeat was fine  . Bell's palsy   . Ovarian cyst   . Sciatica   . Sciatica     Past Surgical History:  Procedure Laterality Date  . LAPAROSCOPIC TUBAL LIGATION  06/28/2012   Procedure: LAPAROSCOPIC TUBAL LIGATION;  Surgeon: Jolayne Haines, MD;  Location: [facility];  Service: Gynecology;  Laterality: Bilateral;  . NO PAST SURGERIES    . VAGINAL DELIVERY     X2    Family History  Problem Relation Age of Onset  . Heart disease Maternal Grandmother        died from heart attack  . Hypertension Mother   . Diabetes Mother   . Hypertension Maternal Aunt   . Cancer Maternal Grandfather        lymph  . Other Neg Hx     Social History   Social History  . Marital status: Single    Spouse name: N/A  . Number of children: N/A  . Years of education: N/A    Social History Main Topics  . Smoking status: Never Smoker  . Smokeless tobacco: Never Used  . Alcohol use 0.6 oz/week    1 Cans of beer per week     Comment: not  with preg  . Drug use: No  . Sexual activity: Yes    Birth control/ protection: None     Comment: tubal    Outpatient Medications Prior to Visit  Medication Sig Dispense Refill  . acetaminophen (TYLENOL) 500 MG tablet Take 1,000 mg by mouth every 6 (six) hours as needed. For pain    . Prenatal Vit-Fe Fumarate-FA (PRENATAL MULTIVITAMIN) TABS Take 1 tablet by mouth every morning.     No facility-administered medications prior to visit.     No Known Allergies  Review of Systems  Constitutional: Negative.   HENT: Negative.   Eyes: Negative.   Respiratory: Negative.   Cardiovascular: Negative.   Gastrointestinal: Positive for heartburn.  Genitourinary: Negative.   Musculoskeletal: Positive for joint pain.  Skin: Negative.   Neurological: Negative.       Objective:    Physical Exam  Constitutional: She is oriented to person, place, and time. She appears well-developed and well-nourished.  HENT:  Head: Normocephalic and atraumatic.  Right Ear: External ear normal.  Left Ear: External ear normal.  Nose: Nose normal.  Mouth/Throat: Oropharynx is clear and moist.  Eyes: Conjunctivae and EOM are normal. Pupils are equal, round, and reactive to light.  Neck: Normal range of motion. Neck supple.  Cardiovascular: Normal rate, regular rhythm, normal heart sounds and intact  distal pulses.   Pulmonary/Chest: Effort normal and breath sounds normal.  Abdominal: Soft. Bowel sounds are normal. There is no tenderness.  Musculoskeletal: Normal range of motion.  Bilateral bony tenderness to lateral great toes. Mild swelling present. No erythema.   Neurological: She is alert and oriented to person, place, and time. She has normal reflexes.  Skin: Skin is warm and dry.  Psychiatric: She has a normal mood and affect.  Nursing note and vitals reviewed.   BP 107/70 (BP Location: Left Arm, Patient Position: Sitting, Cuff Size: Normal)   Pulse 63   Temp 98.2 F (36.8 C) (Oral)   Resp 18    Ht 5' 8" (1.727 m)   Wt 200 lb 12.8 oz (91.1 kg)   SpO2 100%   BMI 30.53 kg/m  Wt Readings from Last 3 Encounters:  11/12/16 200 lb 12.8 oz (91.1 kg)  06/28/12 180 lb (81.6 kg)  06/24/12 180 lb (81.6 kg)    Immunization History  Administered Date(s) Administered  . Tdap 05/13/2012    Lab Results  Component Value Date   TSH 1.110 11/12/2016   Lab Results  Component Value Date   WBC 4.6 11/12/2016   HGB 13.0 11/12/2016   HCT 38.7 11/12/2016   MCV 88 11/12/2016   PLT 293 11/12/2016   Lab Results  Component Value Date   NA 141 11/12/2016   K 4.6 11/12/2016   CO2 24 11/12/2016   GLUCOSE 95 11/12/2016   BUN 8 11/12/2016   CREATININE 0.72 11/12/2016   BILITOT 0.5 11/12/2016   ALKPHOS 51 11/12/2016   AST 10 11/12/2016   ALT 15 11/12/2016   PROT 7.1 11/12/2016   ALBUMIN 4.3 11/12/2016   CALCIUM 9.4 11/12/2016   Lab Results  Component Value Date   CHOL 213 (H) 11/12/2016   Lab Results  Component Value Date   HDL 47 11/12/2016   Lab Results  Component Value Date   LDLCALC 131 (H) 11/12/2016   Lab Results  Component Value Date   TRIG 176 (H) 11/12/2016   Lab Results  Component Value Date   CHOLHDL 4.5 (H) 11/12/2016   Lab Results  Component Value Date   HGBA1C 5.2 11/12/2016       Assessment & Plan:   Problem List Items Addressed This Visit      Digestive   Acid reflux   Relevant Medications   ranitidine (ZANTAC) 150 MG tablet    Other Visit Diagnoses    Annual physical exam    -  Primary   Relevant Orders   CMP14+EGFR (Completed)   CBC with Differential (Completed)   TSH (Completed)   Lipid Panel (Completed)   Vitamin D, 25-hydroxy (Completed)   Hemoglobin A1c (Completed)   Hallux valgus, acquired, bilateral       Relevant Orders   Ambulatory referral to Orthopedics   Arthralgia of toe, unspecified laterality       Relevant Medications   ibuprofen (ADVIL,MOTRIN) 600 MG tablet   Other Relevant Orders   Sedimentation Rate (Completed)     Uric Acid (Completed)       Meds ordered this encounter  Medications  . ranitidine (ZANTAC) 150 MG tablet    Sig: Take 1 tablet (150 mg total) by mouth 2 (two) times daily as needed for heartburn.    Order Specific Question:   Supervising Provider    Answer:   Tresa Garter [id number]  . ibuprofen (ADVIL,MOTRIN) 600 MG tablet    Sig: Take 1 tablet (  600 mg total) by mouth every 8 (eight) hours as needed for moderate pain or cramping (Take with food.).    Dispense:  30 tablet    Refill:  0    Order Specific Question:   Supervising Provider    Answer:   Tresa Garter [id number]     Follow up: Return in about 2 weeks (around 11/26/2016) for PAP.   Fredia Beets, FNP

## 2016-11-13 LAB — CBC WITH DIFFERENTIAL/PLATELET
BASOS ABS: 0 10*3/uL (ref 0.0–0.2)
Basos: 0 %
EOS (ABSOLUTE): 0.1 10*3/uL (ref 0.0–0.4)
Eos: 2 %
HEMOGLOBIN: 13 g/dL (ref 11.1–15.9)
Hematocrit: 38.7 % (ref 34.0–46.6)
Immature Grans (Abs): 0 10*3/uL (ref 0.0–0.1)
Immature Granulocytes: 0 %
LYMPHS ABS: 1.7 10*3/uL (ref 0.7–3.1)
Lymphs: 37 %
MCH: 29.7 pg (ref 26.6–33.0)
MCHC: 33.6 g/dL (ref 31.5–35.7)
MCV: 88 fL (ref 79–97)
MONOCYTES: 9 %
Monocytes Absolute: 0.4 10*3/uL (ref 0.1–0.9)
Neutrophils Absolute: 2.4 10*3/uL (ref 1.4–7.0)
Neutrophils: 52 %
Platelets: 293 10*3/uL (ref 150–379)
RBC: 4.38 x10E6/uL (ref 3.77–5.28)
RDW: 14.6 % (ref 12.3–15.4)
WBC: 4.6 10*3/uL (ref 3.4–10.8)

## 2016-11-13 LAB — CMP14+EGFR
ALK PHOS: 51 IU/L (ref 39–117)
ALT: 15 IU/L (ref 0–32)
AST: 10 IU/L (ref 0–40)
Albumin/Globulin Ratio: 1.5 (ref 1.2–2.2)
Albumin: 4.3 g/dL (ref 3.5–5.5)
BILIRUBIN TOTAL: 0.5 mg/dL (ref 0.0–1.2)
BUN / CREAT RATIO: 11 (ref 9–23)
BUN: 8 mg/dL (ref 6–20)
CHLORIDE: 103 mmol/L (ref 96–106)
CO2: 24 mmol/L (ref 20–29)
Calcium: 9.4 mg/dL (ref 8.7–10.2)
Creatinine, Ser: 0.72 mg/dL (ref 0.57–1.00)
GFR calc Af Amer: 126 mL/min/{1.73_m2} (ref >59)
GFR calc non Af Amer: 110 mL/min/{1.73_m2} (ref >59)
GLUCOSE: 95 mg/dL (ref 65–99)
Globulin, Total: 2.8 g/dL (ref 1.5–4.5)
Potassium: 4.6 mmol/L (ref 3.5–5.2)
Sodium: 141 mmol/L (ref 134–144)
Total Protein: 7.1 g/dL (ref 6.0–8.5)

## 2016-11-13 LAB — LIPID PANEL
CHOLESTEROL TOTAL: 213 mg/dL — AB (ref 100–199)
Chol/HDL Ratio: 4.5 ratio — ABNORMAL HIGH (ref 0.0–4.4)
HDL: 47 mg/dL (ref >39)
LDL Calculated: 131 mg/dL — ABNORMAL HIGH (ref 0–99)
Triglycerides: 176 mg/dL — ABNORMAL HIGH (ref 0–149)
VLDL CHOLESTEROL CAL: 35 mg/dL (ref 5–40)

## 2016-11-13 LAB — HEMOGLOBIN A1C
Est. average glucose Bld gHb Est-mCnc: 103 mg/dL
HEMOGLOBIN A1C: 5.2 % (ref 4.8–5.6)

## 2016-11-13 LAB — TSH: TSH: 1.11 u[IU]/mL (ref 0.450–4.500)

## 2016-11-13 LAB — URIC ACID: URIC ACID: 5.7 mg/dL (ref 2.5–7.1)

## 2016-11-13 LAB — VITAMIN D 25 HYDROXY (VIT D DEFICIENCY, FRACTURES): Vit D, 25-Hydroxy: 10.9 ng/mL — ABNORMAL LOW (ref 30.0–100.0)

## 2016-11-13 LAB — SEDIMENTATION RATE: SED RATE: 2 mm/h (ref 0–32)

## 2016-11-18 ENCOUNTER — Ambulatory Visit (INDEPENDENT_AMBULATORY_CARE_PROVIDER_SITE_OTHER): Payer: Self-pay | Admitting: Orthopedic Surgery

## 2016-11-19 ENCOUNTER — Other Ambulatory Visit: Payer: Self-pay | Admitting: Family Medicine

## 2016-11-19 ENCOUNTER — Telehealth: Payer: Self-pay

## 2016-11-19 DIAGNOSIS — E559 Vitamin D deficiency, unspecified: Secondary | ICD-10-CM

## 2016-11-19 MED ORDER — VITAMIN D (ERGOCALCIFEROL) 1.25 MG (50000 UNIT) PO CAPS: 50000.0000 [IU] | ORAL_CAPSULE | ORAL | 0 refills | Status: AC

## 2016-11-19 NOTE — Telephone Encounter (Signed)
CMA call regarding lab results   Patient Verify DOB   Patient was aware and understood  

## 2016-11-19 NOTE — Telephone Encounter (Signed)
-----  Message from Alfonse Spruce, [place] sent at 11/19/2016  1:35 PM EDT ----- Lipid levels were elevated. This can increase your risk of heart disease overtime. Start eating a diet low in saturated fat. Limit your intake of fried foods, red meats, and whole milk. Increase physical activity. Recommend recheck in 3 months.  Vitamin D level was low. Vitamin D helps to keep bones strong. You were prescribed ergocalciferol (capsules) to increase your vitamin-d level. Once finished start taking OTC vitamin d supplement with 800 international units (IU) of vitamin-d per day. Recommend recheck in 3 months. Labs that evaluated your blood cells, fluid and electrolyte balance are normal. No signs of anemia, infection, or inflammation present. Kidney function normal Liver function normal Thyroid function normal HgbA1c indicates you do not have diabetes.  Uric acid and ESR labs that evaluate for inflammatory conditions like gout or rheumatoid arthritis is negative.

## 2016-11-21 ENCOUNTER — Encounter: Payer: Self-pay | Admitting: Family Medicine

## 2016-11-27 ENCOUNTER — Other Ambulatory Visit: Payer: Medicaid Other | Admitting: Family Medicine

## 2017-06-16 ENCOUNTER — Emergency Department (HOSPITAL_COMMUNITY)
Admission: EM | Admit: 2017-06-16 | Discharge: 2017-06-16 | Disposition: A | Discharge status: Home / Self Care | Payer: BLUE CROSS/BLUE SHIELD | Attending: Emergency Medicine | Admitting: Emergency Medicine

## 2017-06-16 ENCOUNTER — Encounter (HOSPITAL_COMMUNITY): Payer: Self-pay | Admitting: *Deleted

## 2017-06-16 ENCOUNTER — Other Ambulatory Visit: Payer: Self-pay

## 2017-06-16 DIAGNOSIS — R05 Cough: Secondary | ICD-10-CM | POA: Diagnosis not present

## 2017-06-16 DIAGNOSIS — J020 Streptococcal pharyngitis: Secondary | ICD-10-CM | POA: Diagnosis not present

## 2017-06-16 DIAGNOSIS — J029 Acute pharyngitis, unspecified: Secondary | ICD-10-CM | POA: Diagnosis present

## 2017-06-16 DIAGNOSIS — M791 Myalgia, unspecified site: Secondary | ICD-10-CM | POA: Insufficient documentation

## 2017-06-16 LAB — RAPID STREP SCREEN (MED CTR MEBANE ONLY): Streptococcus, Group A Screen (Direct): POSITIVE — AB

## 2017-06-16 MED ORDER — IBUPROFEN 800 MG PO TABS
800.0000 mg | ORAL_TABLET | Freq: Once | ORAL | Status: AC
  Administered 2017-06-16: 800 mg via ORAL
  Filled 2017-06-16: qty 1

## 2017-06-16 MED ORDER — PENICILLIN G BENZATHINE 1200000 UNIT/2ML IM SUSP
1.2000 10*6.[IU] | Freq: Once | INTRAMUSCULAR | Status: AC
  Administered 2017-06-16: 1.2 10*6.[IU] via INTRAMUSCULAR
  Filled 2017-06-16: qty 2

## 2017-06-16 NOTE — ED Triage Notes (Signed)
Pt reports headache, sore throat, bodyaches, cough for several days. Denies vomiting or diarrhea. Mask on pt at triage.

## 2017-06-16 NOTE — ED Provider Notes (Signed)
[facility] EMERGENCY DEPARTMENT Provider Note   CSN: [id number] Arrival date & time: 06/16/17  1004     History   Chief Complaint Chief Complaint  Patient presents with  . Influenza    HPI Nicole Christian is a 37 y.o. female.  HPI  37 year old female presents today with complaints of sore throat and body aches.  Patient notes symptoms started approximately 2 days ago with dry nonproductive cough, body aches, sore throat.  Patient denies any chest pain or shortness of breath, reports painful swallowing, no difficulty swallowing, no changes in voice.  Patient reports that she is felt hot, no objective fever at home.  She reports she has 3 children, none of them are sick presently.  No medications prior to arrival.  Patient was using ibuprofen last night.   Past Medical History:  Diagnosis Date  . Abnormal Pap smear    repeat was fine  . Bell's palsy   . Ovarian cyst   . Sciatica   . Sciatica     Patient Active Problem List   Diagnosis Date Noted  . Musculoskeletal pain 04/01/2012  . Acid reflux 04/01/2012    Past Surgical History:  Procedure Laterality Date  . LAPAROSCOPIC TUBAL LIGATION  06/28/2012   Procedure: LAPAROSCOPIC TUBAL LIGATION;  Surgeon: Delbert Harness, MD;  Location: [facility];  Service: Gynecology;  Laterality: Bilateral;  . NO PAST SURGERIES    . VAGINAL DELIVERY     X2    OB History    Gravida Para Term Preterm AB Living   3 3 3     3    SAB TAB Ectopic Multiple Live Births           3       Home Medications    Prior to Admission medications   Medication Sig Start Date End Date Taking? Authorizing Provider  acetaminophen (TYLENOL) 500 MG tablet Take 1,000 mg by mouth every 6 (six) hours as needed. For pain    [provider]  ibuprofen (ADVIL,MOTRIN) 600 MG tablet Take 1 tablet (600 mg total) by mouth every 8 (eight) hours as needed for moderate pain or cramping (Take with food.). 11/12/16   Lizbeth Bark, FNP   ranitidine (ZANTAC) 150 MG tablet Take 1 tablet (150 mg total) by mouth 2 (two) times daily as needed for heartburn. 11/12/16   Lizbeth Bark, FNP    Family History Family History  Problem Relation Age of Onset  . Heart disease Maternal Grandmother        died from heart attack  . Hypertension Mother   . Diabetes Mother   . Hypertension Maternal Aunt   . Cancer Maternal Grandfather        lymph  . Other Neg Hx     Social History Social History   Tobacco Use  . Smoking status: Never Smoker  . Smokeless tobacco: Never Used  Substance Use Topics  . Alcohol use: Yes    Alcohol/week: 0.6 oz    Types: 1 Cans of beer per week    Comment: not with preg  . Drug use: No     Allergies   Patient has no known allergies.   Review of Systems Review of Systems  All other systems reviewed and are negative.    Physical Exam Updated Vital Signs BP 115/64   Pulse 93   Temp 99.7 F (37.6 C) (Oral)   Resp 16   Ht 5\' 8"  (1.727 m)  Wt 91.6 kg (202 lb)   LMP 06/09/2017   SpO2 97%   BMI 30.71 kg/m   Physical Exam  Constitutional: She is oriented to person, place, and time. She appears well-developed and well-nourished.  HENT:  Head: Normocephalic and atraumatic.  Bilateral tonsillar enlargement, exudate bilateral, they are symmetrical, uvula is normal midline and rises with phonation, no signs of PTA or RPA no pooling of secretions neck is supple full active range of motion  Eyes: Conjunctivae are normal. Pupils are equal, round, and reactive to light. Right eye exhibits no discharge. Left eye exhibits no discharge. No scleral icterus.  Neck: Normal range of motion. No JVD present. No tracheal deviation present.  Pulmonary/Chest: Effort normal and breath sounds normal. No stridor. No respiratory distress. She has no wheezes. She has no rales. She exhibits no tenderness.  Neurological: She is alert and oriented to person, place, and time. Coordination normal.    Psychiatric: She has a normal mood and affect. Her behavior is normal. Judgment and thought content normal.  Nursing note and vitals reviewed.    ED Treatments / Results  Labs (all labs ordered are listed, but only abnormal results are displayed) Labs Reviewed  RAPID STREP SCREEN (NOT AT [facility]) - Abnormal; Notable for the following components:      Result Value   Streptococcus, Group A Screen (Direct) POSITIVE (*)    All other components within normal limits    EKG  EKG Interpretation None       Radiology No results found.  Procedures Procedures (including critical care time)  Medications Ordered in ED Medications  ibuprofen (ADVIL,MOTRIN) tablet 800 mg (800 mg Oral Given 06/16/17 1239)  penicillin g benzathine (BICILLIN LA) 1200000 UNIT/2ML injection 1.2 Million Units (1.2 Million Units Intramuscular Given 06/16/17 1305)     Initial Impression / Assessment and Plan / ED Course  I have reviewed the triage vital signs and the nursing notes.  Pertinent labs & imaging results that were available during my care of the patient were reviewed by me and considered in my medical decision making (see chart for details).      Final Clinical Impressions(s) / ED Diagnoses   Final diagnoses:  Strep pharyngitis   Labs: Rapid strep positive   37 year old female presents today with uncomplicated strep pharyngitis.  No signs of deep space infection, no signs of RPA PTA.  Patient treated with penicillin, she will follow-up immediately with any new or worsening signs or symptoms.  She verbalized understanding and agreement to today's plan had no further questions or concerns at the time of discharge.  ED Discharge Orders    None       Eyvonne MechanicHedges, Ciel Chervenak, Cordelia Poche-C 06/16/17 1357    Vanetta MuldersZackowski, Scott, MD 06/17/17 [id number] [id number]

## 2017-06-16 NOTE — Discharge Instructions (Signed)
Please read attached information. If you experience any new or worsening signs or symptoms please return to the emergency room for evaluation. Please follow-up with your primary care provider or specialist as discussed.  °

## 2017-10-20 DIAGNOSIS — J45991 Cough variant asthma: Secondary | ICD-10-CM | POA: Diagnosis not present

## 2017-10-21 ENCOUNTER — Other Ambulatory Visit: Payer: BLUE CROSS/BLUE SHIELD | Admitting: Family Medicine

## 2018-02-12 ENCOUNTER — Emergency Department (HOSPITAL_BASED_OUTPATIENT_CLINIC_OR_DEPARTMENT_OTHER): Payer: BLUE CROSS/BLUE SHIELD

## 2018-02-12 ENCOUNTER — Encounter (HOSPITAL_BASED_OUTPATIENT_CLINIC_OR_DEPARTMENT_OTHER): Payer: Self-pay | Admitting: *Deleted

## 2018-02-12 ENCOUNTER — Other Ambulatory Visit: Payer: Self-pay

## 2018-02-12 ENCOUNTER — Emergency Department (HOSPITAL_BASED_OUTPATIENT_CLINIC_OR_DEPARTMENT_OTHER)
Admission: EM | Admit: 2018-02-12 | Discharge: 2018-02-12 | Disposition: A | Discharge status: Home / Self Care | Payer: BLUE CROSS/BLUE SHIELD | Attending: Emergency Medicine | Admitting: Emergency Medicine

## 2018-02-12 DIAGNOSIS — M79672 Pain in left foot: Secondary | ICD-10-CM | POA: Diagnosis not present

## 2018-02-12 DIAGNOSIS — M7989 Other specified soft tissue disorders: Secondary | ICD-10-CM | POA: Diagnosis not present

## 2018-02-12 DIAGNOSIS — R600 Localized edema: Secondary | ICD-10-CM | POA: Diagnosis not present

## 2018-02-12 DIAGNOSIS — M25579 Pain in unspecified ankle and joints of unspecified foot: Secondary | ICD-10-CM

## 2018-02-12 MED ORDER — HYDROCODONE-ACETAMINOPHEN 5-325 MG PO TABS
1.0000 | ORAL_TABLET | Freq: Once | ORAL | Status: DC
  Filled 2018-02-12: qty 1

## 2018-02-12 MED ORDER — IBUPROFEN 600 MG PO TABS: 600.0000 mg | ORAL_TABLET | Freq: Three times a day (TID) | ORAL | 0 refills | Status: DC | PRN

## 2018-02-12 MED ORDER — ACETAMINOPHEN 500 MG PO TABS: 1000.0000 mg | ORAL_TABLET | Freq: Four times a day (QID) | ORAL | 0 refills | Status: DC | PRN

## 2018-02-12 NOTE — ED Triage Notes (Signed)
Dorsal left foot pain and swelling for 2 to 3 months.  Shooting pain from dorsal left foot to her left shin. Numbness and tingling from her right elbow all the way to her hand, intermittently.

## 2018-02-12 NOTE — ED Notes (Signed)
Pt understood dc material. NAD noted. Script sent in electronically

## 2018-02-12 NOTE — ED Provider Notes (Signed)
[facility] EMERGENCY DEPARTMENT Provider Note   CSN: [id number] Arrival date & time: 02/12/18  1234     History   Chief Complaint Chief Complaint  Patient presents with  . Foot Pain    HPI Nicole Christian is a 37 y.o. female with no significant past medical history presents emergency department today for left foot pain.  Patient reports that she has had pain and swelling in her left foot for approximately 2-3 months.  She reports that the pain typically occurs in the evening time and after long periods of standing.  She reports that the pain is in the dorsum aspect of her left foot and sometimes radiates to her left shin.  She notes that the pain is sharp in nature and lasts for a few seconds before being resolved.  She is tried ibuprofen for symptoms without any relief.  Patient denies any symptoms of the right lower leg.  She denies any chest pain, shortness of breath, cough, hemoptysis, recent surgery/travel, history of blood clot, exogenous estrogen use.  No associated numbness/tingling of the lower extremity.  She denies any low back pain.  No trauma. No fever.   HPI  Past Medical History:  Diagnosis Date  . Abnormal Pap smear    repeat was fine  . Bell's palsy   . Ovarian cyst   . Sciatica   . Sciatica     Patient Active Problem List   Diagnosis Date Noted  . Musculoskeletal pain 04/01/2012  . Acid reflux 04/01/2012    Past Surgical History:  Procedure Laterality Date  . LAPAROSCOPIC TUBAL LIGATION  06/28/2012   Procedure: LAPAROSCOPIC TUBAL LIGATION;  Surgeon: Delbert Harness, MD;  Location: [facility];  Service: Gynecology;  Laterality: Bilateral;  . NO PAST SURGERIES    . VAGINAL DELIVERY     X2  . WISDOM TOOTH EXTRACTION       OB History    Gravida  3   Para  3   Term  3   Preterm      AB      Living  3     SAB      TAB      Ectopic      Multiple      Live Births  3            Home Medications    Prior to Admission  medications   Medication Sig Start Date End Date Taking? Authorizing Provider  acetaminophen (TYLENOL) 500 MG tablet Take 1,000 mg by mouth every 6 (six) hours as needed. For pain    [provider]  ibuprofen (ADVIL,MOTRIN) 600 MG tablet Take 1 tablet (600 mg total) by mouth every 8 (eight) hours as needed for moderate pain or cramping (Take with food.). 11/12/16   Lizbeth Bark, FNP  ranitidine (ZANTAC) 150 MG tablet Take 1 tablet (150 mg total) by mouth 2 (two) times daily as needed for heartburn. 11/12/16   Lizbeth Bark, FNP    Family History Family History  Problem Relation Age of Onset  . Heart disease Maternal Grandmother        died from heart attack  . Hypertension Mother   . Diabetes Mother   . Hypertension Maternal Aunt   . Cancer Maternal Grandfather        lymph  . Other Neg Hx     Social History Social History   Tobacco Use  . Smoking status: Never Smoker  . Smokeless  tobacco: Never Used  Substance Use Topics  . Alcohol use: Yes    Alcohol/week: 1.0 standard drinks    Types: 1 Cans of beer per week    Comment: occasional  . Drug use: No     Allergies   Patient has no known allergies.   Review of Systems Review of Systems  All other systems reviewed and are negative.    Physical Exam Updated Vital Signs BP (!) 107/93 (BP Location: Left Arm)   Pulse 72   Temp 98.8 F (37.1 C) (Oral)   Resp 18   Ht 5\' 9"  (1.753 m)   Wt 93 kg   LMP 02/04/2018   SpO2 100%   BMI 30.27 kg/m   Physical Exam  Constitutional: She appears well-developed and well-nourished.  HENT:  Head: Normocephalic and atraumatic.  Right Ear: External ear normal.  Left Ear: External ear normal.  Eyes: Conjunctivae are normal. Right eye exhibits no discharge. Left eye exhibits no discharge. No scleral icterus.  Cardiovascular:  Pulses:      Dorsalis pedis pulses are 2+ on the right side, and 2+ on the left side.       Posterior tibial pulses are 2+ on the  right side, and 2+ on the left side.  No lower extremity swelling or edema.  Negative Homans test bilaterally.  Pulmonary/Chest: Effort normal. No respiratory distress.  Musculoskeletal:       Left knee: Normal.       Right ankle: Normal.       Left ankle: Normal. She exhibits normal range of motion. No tenderness. Achilles tendon normal. Achilles tendon exhibits no pain, no defect and normal Thompson's test results.       Lumbar back: Normal.       Left lower leg: Normal.       Left foot: There is decreased range of motion and tenderness. There is no bony tenderness and no swelling.       Feet:  Neurological: She is alert. She has normal strength. No sensory deficit.  Skin: Skin is warm, dry and intact. Capillary refill takes less than 2 seconds. No pallor.  Psychiatric: She has a normal mood and affect.  Nursing note and vitals reviewed.    ED Treatments / Results  Labs (all labs ordered are listed, but only abnormal results are displayed) Labs Reviewed - No data to display  EKG None  Radiology No results found.  Procedures Procedures (including critical care time)  Medications Ordered in ED Medications - No data to display   Initial Impression / Assessment and Plan / ED Course  I have reviewed the triage vital signs and the nursing notes.  Pertinent labs & imaging results that were available during my care of the patient were reviewed by me and considered in my medical decision making (see chart for details).     37 y.o. female with intermittent left foot pain and swelling over the last 2-3 months.  She denies any trauma.  She is neurovascular intact and compartments are soft.  Ultrasound without evidence of DVT.  X-rays without evidence of fracture dislocation.  Images reviewed by myself.  Do not suspect septic joint as the patient has normal range of motion, is without joint swelling, overlying erythema and is afebrile.  Will tx conservatively. Denies back pain or  radiation of pain. Will give cam walker in the department. Recommended follow up with pcp vs sports medicine if symptoms continue. Patient deferred pain tx while in the department.  Specific return precautions discussed. Time was given for all questions to be answered. The patient verbalized understanding and agreement with plan. The patient appears safe for discharge home.  Final Clinical Impressions(s) / ED Diagnoses   Final diagnoses:  Foot pain, left    ED Discharge Orders         Ordered    acetaminophen (TYLENOL) 500 MG tablet  Every 6 hours PRN     02/12/18 1611    ibuprofen (ADVIL,MOTRIN) 600 MG tablet  Every 8 hours PRN     02/12/18 1611           Princella PellegriniMaczis, Calum Cormier M, PA-C 02/12/18 1613    Vanetta MuldersZackowski, Scott, MD 02/13/18 [phone]

## 2018-03-30 ENCOUNTER — Ambulatory Visit: Payer: BLUE CROSS/BLUE SHIELD | Admitting: Family Medicine

## 2018-04-13 ENCOUNTER — Ambulatory Visit: Payer: BLUE CROSS/BLUE SHIELD | Admitting: Family Medicine

## 2018-04-25 ENCOUNTER — Ambulatory Visit (INDEPENDENT_AMBULATORY_CARE_PROVIDER_SITE_OTHER): Payer: BLUE CROSS/BLUE SHIELD | Admitting: Family Medicine

## 2018-04-25 ENCOUNTER — Encounter: Payer: Self-pay | Admitting: Family Medicine

## 2018-04-25 VITALS — BP 109/72 | HR 66 | Resp 17 | Ht 68.0 in | Wt 194.0 lb

## 2018-04-25 DIAGNOSIS — Z3202 Encounter for pregnancy test, result negative: Secondary | ICD-10-CM

## 2018-04-25 DIAGNOSIS — Z7689 Persons encountering health services in other specified circumstances: Secondary | ICD-10-CM

## 2018-04-25 DIAGNOSIS — Z13220 Encounter for screening for lipoid disorders: Secondary | ICD-10-CM

## 2018-04-25 DIAGNOSIS — Z1329 Encounter for screening for other suspected endocrine disorder: Secondary | ICD-10-CM

## 2018-04-25 DIAGNOSIS — Z130 Encounter for screening for diseases of the blood and blood-forming organs and certain disorders involving the immune mechanism: Secondary | ICD-10-CM

## 2018-04-25 DIAGNOSIS — Z0000 Encounter for general adult medical examination without abnormal findings: Secondary | ICD-10-CM | POA: Diagnosis not present

## 2018-04-25 DIAGNOSIS — Z1389 Encounter for screening for other disorder: Secondary | ICD-10-CM

## 2018-04-25 DIAGNOSIS — Z6829 Body mass index (BMI) 29.0-29.9, adult: Secondary | ICD-10-CM

## 2018-04-25 LAB — POCT URINALYSIS DIP (CLINITEK)
BILIRUBIN UA: NEGATIVE
BILIRUBIN UA: NEGATIVE mg/dL
GLUCOSE UA: NEGATIVE mg/dL
LEUKOCYTES UA: NEGATIVE
Nitrite, UA: NEGATIVE
POC PROTEIN,UA: NEGATIVE
SPEC GRAV UA: 1.025 (ref 1.010–1.025)
Urobilinogen, UA: 0.2 E.U./dL
pH, UA: 6 (ref 5.0–8.0)

## 2018-04-25 LAB — POCT URINE PREGNANCY: Preg Test, Ur: NEGATIVE

## 2018-04-25 NOTE — Patient Instructions (Addendum)
Thank you for choosing Primary Care at [facility] to be your medical home!    Nicole Christian was seen by Molli Barrows, FNP today.   [name]'s primary care provider is Scot Jun, FNP.   For the best care possible, you should try to see Molli Barrows, FNP-C whenever you come to the clinic.   We look forward to seeing you again soon!  If you have any questions about your visit today, please call us at [phone] or feel free to reach your primary care provider via [place].       Health Maintenance, Female Adopting a healthy lifestyle and getting preventive care can go a long way to promote health and wellness. Talk with your health care provider about what schedule of regular examinations is right for you. This is a good chance for you to check in with your provider about disease prevention and staying healthy. In between checkups, there are plenty of things you can do on your own. Experts have done a lot of research about which lifestyle changes and preventive measures are most likely to keep you healthy. Ask your health care provider for more information. Weight and diet Eat a healthy diet  Be sure to include plenty of vegetables, fruits, low-fat dairy products, and lean protein.  Do not eat a lot of foods high in solid fats, added sugars, or salt.  Get regular exercise. This is one of the most important things you can do for your health. ? Most adults should exercise for at least 150 minutes each week. The exercise should increase your heart rate and make you sweat (moderate-intensity exercise). ? Most adults should also do strengthening exercises at least twice a week. This is in addition to the moderate-intensity exercise.  Maintain a healthy weight  Body mass index (BMI) is a measurement that can be used to identify possible weight problems. It estimates body fat based on height and weight. Your health care provider can help determine your BMI and  help you achieve or maintain a healthy weight.  For females 36 years of age and older: ? A BMI below 18.5 is considered underweight. ? A BMI of 18.5 to 24.9 is normal. ? A BMI of 25 to 29.9 is considered overweight. ? A BMI of 30 and above is considered obese.  Watch levels of cholesterol and blood lipids  You should start having your blood tested for lipids and cholesterol at 36 years of age, then have this test every 5 years.  You may need to have your cholesterol levels checked more often if: ? Your lipid or cholesterol levels are high. ? You are older than 36 years of age. ? You are at high risk for heart disease.  Cancer screening Lung Cancer  Lung cancer screening is recommended for adults 83-67 years old who are at high risk for lung cancer because of a history of smoking.  A yearly low-dose CT scan of the lungs is recommended for people who: ? Currently smoke. ? Have quit within the past 15 years. ? Have at least a 30-pack-year history of smoking. A pack year is smoking an average of one pack of cigarettes a day for 1 year.  Yearly screening should continue until it has been 15 years since you quit.  Yearly screening should stop if you develop a health problem that would prevent you from having lung cancer treatment.  Breast Cancer  Practice breast self-awareness. This means understanding how your breasts normally  appear and feel.  It also means doing regular breast self-exams. Let your health care provider know about any changes, no matter how small.  If you are in your 20s or 30s, you should have a clinical breast exam (CBE) by a health care provider every 1-3 years as part of a regular health exam.  If you are 75 or older, have a CBE every year. Also consider having a breast X-ray (mammogram) every year.  If you have a family history of breast cancer, talk to your health care provider about genetic screening.  If you are at high risk for breast cancer, talk to  your health care provider about having an MRI and a mammogram every year.  Breast cancer gene (BRCA) assessment is recommended for women who have family members with BRCA-related cancers. BRCA-related cancers include: ? Breast. ? Ovarian. ? Tubal. ? Peritoneal cancers.  Results of the assessment will determine the need for genetic counseling and BRCA1 and BRCA2 testing.  Cervical Cancer Your health care provider may recommend that you be screened regularly for cancer of the pelvic organs (ovaries, uterus, and vagina). This screening involves a pelvic examination, including checking for microscopic changes to the surface of your cervix (Pap test). You may be encouraged to have this screening done every 3 years, beginning at age 38.  For women ages 71-65, health care providers may recommend pelvic exams and Pap testing every 3 years, or they may recommend the Pap and pelvic exam, combined with testing for human papilloma virus (HPV), every 5 years. Some types of HPV increase your risk of cervical cancer. Testing for HPV may also be done on women of any age with unclear Pap test results.  Other health care providers may not recommend any screening for nonpregnant women who are considered low risk for pelvic cancer and who do not have symptoms. Ask your health care provider if a screening pelvic exam is right for you.  If you have had past treatment for cervical cancer or a condition that could lead to cancer, you need Pap tests and screening for cancer for at least 20 years after your treatment. If Pap tests have been discontinued, your risk factors (such as having a new sexual partner) need to be reassessed to determine if screening should resume. Some women have medical problems that increase the chance of getting cervical cancer. In these cases, your health care provider may recommend more frequent screening and Pap tests.  Colorectal Cancer  This type of cancer can be detected and often  prevented.  Routine colorectal cancer screening usually begins at 36 years of age and continues through 36 years of age.  Your health care provider may recommend screening at an earlier age if you have risk factors for colon cancer.  Your health care provider may also recommend using home test kits to check for hidden blood in the stool.  A small camera at the end of a tube can be used to examine your colon directly (sigmoidoscopy or colonoscopy). This is done to check for the earliest forms of colorectal cancer.  Routine screening usually begins at age 28.  Direct examination of the colon should be repeated every 5-10 years through 36 years of age. However, you may need to be screened more often if early forms of precancerous polyps or small growths are found.  Skin Cancer  Check your skin from head to toe regularly.  Tell your health care provider about any new moles or changes in moles, especially  if there is a change in a mole's shape or color.  Also tell your health care provider if you have a mole that is larger than the size of a pencil eraser.  Always use sunscreen. Apply sunscreen liberally and repeatedly throughout the day.  Protect yourself by wearing long sleeves, pants, a wide-brimmed hat, and sunglasses whenever you are outside.  Heart disease, diabetes, and high blood pressure  High blood pressure causes heart disease and increases the risk of stroke. High blood pressure is more likely to develop in: ? People who have blood pressure in the high end of the normal range (130-139/85-89 mm Hg). ? People who are overweight or obese. ? People who are African American.  If you are 6-57 years of age, have your blood pressure checked every 3-5 years. If you are 72 years of age or older, have your blood pressure checked every year. You should have your blood pressure measured twice-once when you are at a hospital or clinic, and once when you are not at a hospital or clinic.  Record the average of the two measurements. To check your blood pressure when you are not at a hospital or clinic, you can use: ? An automated blood pressure machine at a pharmacy. ? A home blood pressure monitor.  If you are between 7 years and 33 years old, ask your health care provider if you should take aspirin to prevent strokes.  Have regular diabetes screenings. This involves taking a blood sample to check your fasting blood sugar level. ? If you are at a normal weight and have a low risk for diabetes, have this test once every three years after 36 years of age. ? If you are overweight and have a high risk for diabetes, consider being tested at a younger age or more often. Preventing infection Hepatitis B  If you have a higher risk for hepatitis B, you should be screened for this virus. You are considered at high risk for hepatitis B if: ? You were born in a country where hepatitis B is common. Ask your health care provider which countries are considered high risk. ? Your parents were born in a high-risk country, and you have not been immunized against hepatitis B (hepatitis B vaccine). ? You have HIV or AIDS. ? You use needles to inject street drugs. ? You live with someone who has hepatitis B. ? You have had sex with someone who has hepatitis B. ? You get hemodialysis treatment. ? You take certain medicines for conditions, including cancer, organ transplantation, and autoimmune conditions.  Hepatitis C  Blood testing is recommended for: ? Everyone born from [birth year] through 1964. ? Anyone with known risk factors for hepatitis C.  Sexually transmitted infections (STIs)  You should be screened for sexually transmitted infections (STIs) including gonorrhea and chlamydia if: ? You are sexually active and are younger than 36 years of age. ? You are older than 36 years of age and your health care provider tells you that you are at risk for this type of infection. ? Your sexual  activity has changed since you were last screened and you are at an increased risk for chlamydia or gonorrhea. Ask your health care provider if you are at risk.  If you do not have HIV, but are at risk, it may be recommended that you take a prescription medicine daily to prevent HIV infection. This is called pre-exposure prophylaxis (PrEP). You are considered at risk if: ? You are sexually active  and do not regularly use condoms or know the HIV status of your partner(s). ? You take drugs by injection. ? You are sexually active with a partner who has HIV.  Talk with your health care provider about whether you are at high risk of being infected with HIV. If you choose to begin PrEP, you should first be tested for HIV. You should then be tested every 3 months for as long as you are taking PrEP. Pregnancy  If you are premenopausal and you may become pregnant, ask your health care provider about preconception counseling.  If you may become pregnant, take 400 to 800 micrograms (mcg) of folic acid every day.  If you want to prevent pregnancy, talk to your health care provider about birth control (contraception). Osteoporosis and menopause  Osteoporosis is a disease in which the bones lose minerals and strength with aging. This can result in serious bone fractures. Your risk for osteoporosis can be identified using a bone density scan.  If you are 3 years of age or older, or if you are at risk for osteoporosis and fractures, ask your health care provider if you should be screened.  Ask your health care provider whether you should take a calcium or vitamin D supplement to lower your risk for osteoporosis.  Menopause may have certain physical symptoms and risks.  Hormone replacement therapy may reduce some of these symptoms and risks. Talk to your health care provider about whether hormone replacement therapy is right for you. Follow these instructions at home:  Schedule regular health, dental,  and eye exams.  Stay current with your immunizations.  Do not use any tobacco products including cigarettes, chewing tobacco, or electronic cigarettes.  If you are pregnant, do not drink alcohol.  If you are breastfeeding, limit how much and how often you drink alcohol.  Limit alcohol intake to no more than 1 drink per day for nonpregnant women. One drink equals 12 ounces of beer, 5 ounces of wine, or 1 ounces of hard liquor.  Do not use street drugs.  Do not share needles.  Ask your health care provider for help if you need support or information about quitting drugs.  Tell your health care provider if you often feel depressed.  Tell your health care provider if you have ever been abused or do not feel safe at home. This information is not intended to replace advice given to you by your health care provider. Make sure you discuss any questions you have with your health care provider. Document Released: 12/01/2010 Document Revised: 10/24/2015 Document Reviewed: 02/19/2015 Elsevier Interactive Patient Education  [employer].

## 2018-04-25 NOTE — Progress Notes (Signed)
Nicole ChapelMarquita Christian, is a 37 y.o. female  ZOX:[id number]  [id number]  DOB - [birth year]  CC:  Chief Complaint  Patient presents with  . Establish Care       HPI: Nicole FordMarquita is a 37 y.o. female is here today to establish care.   Nicole CollegeMarquita T Christian has Musculoskeletal pain and Acid Christian on their problem list.    Today's visit:  Here today to establish care. Denies any major health concerns. She has adopted new healthy eating lifestyle and is consistently exercising at least 5 times per day.  She has stopped eating pork and beef.  Diet consists of mostly chicken and fish and vegetables.  Her family history significant for diabetes and heart disease.  She has no prior history of either.  Previous weight exceeded 200 pounds today her current BMI Body mass index is 29.5 kg/m.  She is a non-smoker.  Patient denies new headaches, chest pain, abdominal pain, nausea, new weakness , numbness or tingling, SOB, edema, or worrisome cough.     Current medications:No current outpatient medications on file.   Pertinent family medical history: family history includes Cancer in her maternal grandfather; Diabetes in her mother; Heart attack in her maternal grandmother; Heart disease in her maternal grandmother; Hypertension in her maternal aunt and mother.   No Known Allergies  Social History   Socioeconomic History  . Marital status: Single    Spouse name: Not on file  . Number of children: Not on file  . Years of education: Not on file  . Highest education level: Not on file  Occupational History  . Not on file  Social Needs  . Financial resource strain: Not on file  . Food insecurity:    Worry: Not on file    Inability: Not on file  . Transportation needs:    Medical: Not on file    Non-medical: Not on file  Tobacco Use  . Smoking status: Never Smoker  . Smokeless tobacco: Never Used  Substance and Sexual Activity  . Alcohol use: Yes    Alcohol/week: 1.0 standard drinks    Types: 1  Cans of beer per week    Comment: occasional  . Drug use: No  . Sexual activity: Yes    Birth control/protection: None    Comment: tubal  Lifestyle  . Physical activity:    Days per week: Not on file    Minutes per session: Not on file  . Stress: Not on file  Relationships  . Social connections:    Talks on phone: Not on file    Gets together: Not on file    Attends religious service: Not on file    Active member of club or organization: Not on file    Attends meetings of clubs or organizations: Not on file    Relationship status: Not on file  . Intimate partner violence:    Fear of current or ex partner: Not on file    Emotionally abused: Not on file    Physically abused: Not on file    Forced sexual activity: Not on file  Other Topics Concern  . Not on file  Social History Narrative  . Not on file    Review of Systems: Constitutional: Negative for fever, chills, diaphoresis, activity change, appetite change and fatigue. HENT: Negative for ear pain, nosebleeds, congestion, facial swelling, rhinorrhea, neck pain, neck stiffness and ear discharge.  Eyes: Negative for pain, discharge, redness, itching and visual disturbance. Respiratory: Negative for cough, choking, chest  tightness, shortness of breath, wheezing and stridor.  Cardiovascular: Negative for chest pain, palpitations and leg swelling. Gastrointestinal: Negative for abdominal distention. Genitourinary: Negative for dysuria, urgency, frequency, hematuria, flank pain, decreased urine volume, difficulty urinating. Musculoskeletal:Occasional left wrist pain with typing on the computer. Neurological: Negative for dizziness, tremors, seizures, syncope, facial asymmetry, speech difficulty, weakness, light-headedness, numbness and headaches.  Hematological: Negative for adenopathy. Does not bruise/bleed easily. Psychiatric/Behavioral: Negative for hallucinations, behavioral problems, confusion, dysphoric mood, decreased  concentration and agitation.    Objective:   Vitals:   04/25/18 0835  BP: 109/72  Pulse: 66  Resp: 17  SpO2: 97%    BP Readings from Last 3 Encounters:  04/25/18 109/72  02/12/18 122/77  06/16/17 115/64    Filed Weights   04/25/18 0835  Weight: 194 lb (88 kg)      Physical Exam: Constitutional: Patient appears well-developed and well-nourished. No distress. HENT: Normocephalic, atraumatic, External right and left ear normal. Oropharynx is clear and moist.  Eyes: Conjunctivae and EOM are normal. PERRLA, no scleral icterus. Neck: Normal ROM. Neck supple. No JVD. No tracheal deviation. No thyromegaly. CVS: RRR, S1/S2 +, no murmurs, no gallops, no carotid bruit.  Pulmonary: Effort and breath sounds normal, no stridor, rhonchi, wheezes, rales.  Abdominal: Soft. BS +, no distension, tenderness, rebound or guarding.  Musculoskeletal: Normal range of motion. No edema and no tenderness.  Neuro: Alert. Normal muscle tone coordination. Normal gait. BUE and BLE strength 5/5. Bilateral hand grips symmetrical. No cranial nerve deficit. Skin: Skin is warm and dry. No rash noted. Not diaphoretic. No erythema. No pallor. Psychiatric: Normal mood and affect. Behavior, judgment, thought content normal.  Lab Results (prior encounters)  Lab Results  Component Value Date   WBC 4.6 11/12/2016   HGB 13.0 11/12/2016   HCT 38.7 11/12/2016   MCV 88 11/12/2016   PLT 293 11/12/2016   Lab Results  Component Value Date   CREATININE 0.72 11/12/2016   BUN 8 11/12/2016   NA 141 11/12/2016   K 4.6 11/12/2016   CL 103 11/12/2016   CO2 24 11/12/2016    Lab Results  Component Value Date   HGBA1C 5.2 11/12/2016       Component Value Date/Time   CHOL 213 (H) 11/12/2016 1144   TRIG 176 (H) 11/12/2016 1144   HDL 47 11/12/2016 1144   CHOLHDL 4.5 (H) 11/12/2016 1144   LDLCALC 131 (H) 11/12/2016 1144        Assessment and plan:  1. Encounter to establish care  2. Screening, lipid,  previously abnormal 2018. Rechecking today Lipid panel  3. BMI 29.0-29.9,adult Encouraged efforts to reduce weight include engaging in physical activity as tolerated with goal of 150 minutes per week. Improve dietary choices and eat a meal regimen consistent with a Mediterranean or DASH diet. Reduce simple carbohydrates. Do not skip meals and eat healthy snacks throughout the day to avoid over-eating at dinner. Set a goal weight loss that is achievable for you. Encouraged a healthy weight range 155-165 lb. (BMI 26 goal) Checking hemoglobin A1c  4. Screening for thyroid disorder - Thyroid Panel With TSH  5. Annual physical exam Age-specific anticipatory guidance provided  - Comprehensive metabolic panel - POCT URINALYSIS DIP (CLINITEK) - POCT urine pregnancy  6. Screening, anemia, deficiency, iron - CBC with Differential   Orders Placed This Encounter  Procedures  . Lipid panel  . Thyroid Panel With TSH  . CBC with Differential  . Comprehensive metabolic panel  . Hemoglobin A1c  .  POCT URINALYSIS DIP (CLINITEK)  . POCT urine pregnancy     Return in about 1 year (around 04/26/2019) for Complete Physical Exam.  The patient was given clear instructions to go to ER or return to medical center if symptoms don't improve, worsen or new problems develop. The patient verbalized understanding. The patient was advised  to call and obtain lab results if they haven't heard anything from out office within 7-10 business days.  Joaquin Courts, FNP [facility] at [facility] [address], [place] [address] [phone]: [phone]    This note has been created with Dragon speech recognition software and Paediatric nurse. Any transcriptional errors are unintentional.

## 2018-04-26 LAB — LIPID PANEL
CHOL/HDL RATIO: 3.2 ratio (ref 0.0–4.4)
Cholesterol, Total: 152 mg/dL (ref 100–199)
HDL: 47 mg/dL (ref >39)
LDL CALC: 97 mg/dL (ref 0–99)
TRIGLYCERIDES: 39 mg/dL (ref 0–149)
VLDL Cholesterol Cal: 8 mg/dL (ref 5–40)

## 2018-04-26 LAB — COMPREHENSIVE METABOLIC PANEL
A/G RATIO: 2 (ref 1.2–2.2)
ALT: 10 IU/L (ref 0–32)
AST: 13 IU/L (ref 0–40)
Albumin: 4.8 g/dL (ref 3.5–5.5)
Alkaline Phosphatase: 59 IU/L (ref 39–117)
BUN/Creatinine Ratio: 10 (ref 9–23)
BUN: 8 mg/dL (ref 6–20)
Bilirubin Total: 1.4 mg/dL — ABNORMAL HIGH (ref 0.0–1.2)
CALCIUM: 10.1 mg/dL (ref 8.7–10.2)
CHLORIDE: 104 mmol/L (ref 96–106)
CO2: 19 mmol/L — ABNORMAL LOW (ref 20–29)
Creatinine, Ser: 0.77 mg/dL (ref 0.57–1.00)
GFR, EST AFRICAN AMERICAN: 115 mL/min/{1.73_m2} (ref >59)
GFR, EST NON AFRICAN AMERICAN: 100 mL/min/{1.73_m2} (ref >59)
GLUCOSE: 76 mg/dL (ref 65–99)
Globulin, Total: 2.4 g/dL (ref 1.5–4.5)
POTASSIUM: 4.2 mmol/L (ref 3.5–5.2)
Sodium: 139 mmol/L (ref 134–144)
TOTAL PROTEIN: 7.2 g/dL (ref 6.0–8.5)

## 2018-04-26 LAB — CBC WITH DIFFERENTIAL/PLATELET
BASOS ABS: 0 10*3/uL (ref 0.0–0.2)
Basos: 0 %
EOS (ABSOLUTE): 0.2 10*3/uL (ref 0.0–0.4)
Eos: 2 %
Hematocrit: 40.1 % (ref 34.0–46.6)
Hemoglobin: 13.5 g/dL (ref 11.1–15.9)
IMMATURE GRANS (ABS): 0.1 10*3/uL (ref 0.0–0.1)
IMMATURE GRANULOCYTES: 1 %
LYMPHS: 31 %
Lymphocytes Absolute: 2.9 10*3/uL (ref 0.7–3.1)
MCH: 30.7 pg (ref 26.6–33.0)
MCHC: 33.7 g/dL (ref 31.5–35.7)
MCV: 91 fL (ref 79–97)
MONOS ABS: 0.7 10*3/uL (ref 0.1–0.9)
Monocytes: 8 %
Neutrophils Absolute: 5.4 10*3/uL (ref 1.4–7.0)
Neutrophils: 58 %
PLATELETS: 165 10*3/uL (ref 150–450)
RBC: 4.4 x10E6/uL (ref 3.77–5.28)
RDW: 10.9 % — AB (ref 12.3–15.4)
WBC: 9.4 10*3/uL (ref 3.4–10.8)

## 2018-04-26 LAB — THYROID PANEL WITH TSH
Free Thyroxine Index: 1.9 (ref 1.2–4.9)
T3 UPTAKE RATIO: 25 % (ref 24–39)
T4, Total: 7.4 ug/dL (ref 4.5–12.0)
TSH: 0.773 u[IU]/mL (ref 0.450–4.500)

## 2018-04-26 LAB — URINE CULTURE

## 2018-04-26 LAB — HEMOGLOBIN A1C
ESTIMATED AVERAGE GLUCOSE: 88 mg/dL
Hgb A1c MFr Bld: 4.7 % — ABNORMAL LOW (ref 4.8–5.6)

## 2018-05-16 ENCOUNTER — Ambulatory Visit: Payer: BLUE CROSS/BLUE SHIELD | Admitting: Family Medicine

## 2018-05-27 ENCOUNTER — Other Ambulatory Visit (HOSPITAL_COMMUNITY)
Admission: RE | Admit: 2018-05-27 | Discharge: 2018-05-27 | Disposition: A | Discharge status: Home / Self Care | Payer: BLUE CROSS/BLUE SHIELD | Source: Ambulatory Visit | Attending: Family Medicine | Admitting: Family Medicine

## 2018-05-27 ENCOUNTER — Ambulatory Visit (INDEPENDENT_AMBULATORY_CARE_PROVIDER_SITE_OTHER): Payer: BLUE CROSS/BLUE SHIELD | Admitting: Family Medicine

## 2018-05-27 ENCOUNTER — Encounter: Payer: Self-pay | Admitting: Family Medicine

## 2018-05-27 VITALS — BP 121/78 | HR 74 | Resp 17 | Ht 68.0 in | Wt 193.0 lb

## 2018-05-27 DIAGNOSIS — Z124 Encounter for screening for malignant neoplasm of cervix: Secondary | ICD-10-CM | POA: Insufficient documentation

## 2018-05-27 NOTE — Progress Notes (Signed)
Established Patient Office Visit  Subjective:  Patient ID: Nicole PartridgeMarquita Tranessa Christian, female    DOB: [birth year]  Age: 37 y.o. MRN: [id number]  CC:  Chief Complaint  Patient presents with  . Gynecologic Exam   HPI Nicole Christian presents for a routine PAP smear. History if significant for abnormal PAP and ovarian cyst. Repeat PAP was normal. Denies dysuria, vaginal irritation, or menstrual irregularity. Patient's last menstrual period was 05/22/2018 (exact date).  Past Medical History:  Diagnosis Date  . Abnormal Pap smear    repeat was fine  . Bell's palsy   . Ovarian cyst   . Sciatica   . Sciatica     Past Surgical History:  Procedure Laterality Date  . LAPAROSCOPIC TUBAL LIGATION  06/28/2012   Procedure: LAPAROSCOPIC TUBAL LIGATION;  Surgeon: Delbert Harnessaniel H Moore, MD;  Location: [facility];  Service: Gynecology;  Laterality: Bilateral;  . NO PAST SURGERIES    . VAGINAL DELIVERY     X2  . WISDOM TOOTH EXTRACTION      Family History  Problem Relation Age of Onset  . Heart disease Maternal Grandmother        died from heart attack  . Heart attack Maternal Grandmother   . Hypertension Mother   . Diabetes Mother   . Hypertension Maternal Aunt   . Cancer Maternal Grandfather        lymph  . Other Neg Hx     Social History   Socioeconomic History  . Marital status: Single    Spouse name: Not on file  . Number of children: Not on file  . Years of education: Not on file  . Highest education level: Not on file  Occupational History  . Not on file  Social Needs  . Financial resource strain: Not on file  . Food insecurity:    Worry: Not on file    Inability: Not on file  . Transportation needs:    Medical: Not on file    Non-medical: Not on file  Tobacco Use  . Smoking status: Never Smoker  . Smokeless tobacco: Never Used  Substance and Sexual Activity  . Alcohol use: Yes    Alcohol/week: 1.0 standard drinks    Types: 1 Cans of beer per week    Comment:  occasional  . Drug use: No  . Sexual activity: Yes    Birth control/protection: None    Comment: tubal  Lifestyle  . Physical activity:    Days per week: Not on file    Minutes per session: Not on file  . Stress: Not on file  Relationships  . Social connections:    Talks on phone: Not on file    Gets together: Not on file    Attends religious service: Not on file    Active member of club or organization: Not on file    Attends meetings of clubs or organizations: Not on file    Relationship status: Not on file  . Intimate partner violence:    Fear of current or ex partner: Not on file    Emotionally abused: Not on file    Physically abused: Not on file    Forced sexual activity: Not on file  Other Topics Concern  . Not on file  Social History Narrative  . Not on file    ROS Review of Systems Pertinent negatives listed in HPI   Objective:    Physical Exam  BP 121/78   Pulse 74   Resp  17   Ht 5\' 8"  (1.727 m)   LMP 05/22/2018 (Exact Date)   SpO2 95%   BMI 29.50 kg/m  Wt Readings from Last 3 Encounters:  04/25/18 194 lb (88 kg)  02/12/18 205 lb (93 kg)  06/16/17 202 lb (91.6 kg)   General appearance: alert, well developed, well nourished, cooperative and in no distress Head: Normocephalic, without obvious abnormality, atraumatic Respiratory: Respirations even and unlabored, normal respiratory rate Extremities: No gross deformities Genitourinary: Breasts are symmetric without cutaneous changes, nipple inversion or discharge. No masses or tenderness, and no axillary lymphadenopathy. Normal female external genitalia without lesion. No inguinal lymphadenopathy. Vaginal mucosa is pink and moist without lesions. Cervix is closed without discharge, not friable. Pap smear obtained. No cervical motion tenderness, adnexal fullness or tenderness. Skin: Skin color, texture, turgor normal. No rashes seen  Psych: Appropriate mood and affect. Neurologic: Mental status: Alert,  oriented to person, place, and time, thought content appropriate. Health Maintenance Due  Topic Date Due  . PAP SMEAR-Modifier  11/25/2014    There are no preventive care reminders to display for this patient.  Lab Results  Component Value Date   TSH 0.773 04/25/2018   Lab Results  Component Value Date   WBC 9.4 04/25/2018   HGB 13.5 04/25/2018   HCT 40.1 04/25/2018   MCV 91 04/25/2018   PLT 165 04/25/2018   Lab Results  Component Value Date   NA 139 04/25/2018   K 4.2 04/25/2018   CO2 19 (L) 04/25/2018   GLUCOSE 76 04/25/2018   BUN 8 04/25/2018   CREATININE 0.77 04/25/2018   BILITOT 1.4 (H) 04/25/2018   ALKPHOS 59 04/25/2018   AST 13 04/25/2018   ALT 10 04/25/2018   PROT 7.2 04/25/2018   ALBUMIN 4.8 04/25/2018   CALCIUM 10.1 04/25/2018   Lab Results  Component Value Date   CHOL 152 04/25/2018   Lab Results  Component Value Date   HDL 47 04/25/2018   Lab Results  Component Value Date   LDLCALC 97 04/25/2018   Lab Results  Component Value Date   TRIG 39 04/25/2018   Lab Results  Component Value Date   CHOLHDL 3.2 04/25/2018   Lab Results  Component Value Date   HGBA1C 4.7 (L) 04/25/2018      Assessment & Plan:   Problem List Items Addressed This Visit    None    Visit Diagnoses    Pap smear for cervical cancer screening    -  Primary   Relevant Orders   Cytology - PAP([place]), pending Repeat in 3 years if normal       Follow-up: Return in about 1 year (around 05/28/2019).    Joaquin CourtsKimberly Blossom Crume, FNP

## 2018-05-27 NOTE — Patient Instructions (Signed)
Pap Test  Why am I having this test?  A Pap test, also called a Pap smear, is a screening test to check for signs of:  · Cancer of the vagina, cervix, and uterus. The cervix is the lower part of the uterus that opens into the vagina.  · Infection.  · Changes that may be a sign that cancer is developing (precancerous changes).  Women need this test on a regular basis. In general, you should have a Pap test every 3 years until you reach menopause or age 36. Women aged 30-60 may choose to have their Pap test done at the same time as an HPV (human papillomavirus) test every 5 years (instead of every 3 years).  Your health care provider may recommend having Pap tests more or less often depending on your medical conditions and past Pap test results.  What kind of sample is taken?    Your health care provider will collect a sample of cells from the surface of your cervix. This will be done using a small cotton swab, plastic spatula, or brush. This sample is often collected during a pelvic exam, when you are lying on your back on an exam table with feet in footrests (stirrups).  In some cases, fluids (secretions) from the cervix or vagina may also be collected.  How do I prepare for this test?  · Be aware of where you are in your menstrual cycle. If you are menstruating on the day of the test, you may be asked to reschedule.  · You may need to reschedule if you have a known vaginal infection on the day of the test.  · Follow instructions from your health care provider about:  ? Changing or stopping your regular medicines. Some medicines can cause abnormal test results, such as digitalis and tetracycline.  ? Avoiding douching or taking a bath the day before or the day of the test.  Tell a health care provider about:  · Any allergies you have.  · All medicines you are taking, including vitamins, herbs, eye drops, creams, and over-the-counter medicines.  · Any blood disorders you have.  · Any surgeries you have had.  · Any  medical conditions you have.  · Whether you are pregnant or may be pregnant.  How are the results reported?  Your test results will be reported as either abnormal or normal.  A false-positive result can occur. A false positive is incorrect because it means that a condition is present when it is not.  A false-negative result can occur. A false negative is incorrect because it means that a condition is not present when it is.  What do the results mean?  A normal test result means that you do not have signs of cancer of the vagina, cervix, or uterus.  An abnormal result may mean that you have:  · Cancer. A Pap test by itself is not enough to diagnose cancer. You will have more tests done in this case.  · Precancerous changes in your vagina, cervix, or uterus.  · Inflammation of the cervix.  · An STD (sexually transmitted disease).  · A fungal infection.  · A parasite infection.  Talk with your health care provider about what your results mean.  Questions to ask your health care provider  Ask your health care provider, or the department that is doing the test:  · When will my results be ready?  · How will I get my results?  · What are my   treatment options?  · What other tests do I need?  · What are my next steps?  Summary  · In general, women should have a Pap test every 3 years until they reach menopause or age 36.  · Your health care provider will collect a sample of cells from the surface of your cervix. This will be done using a small cotton swab, plastic spatula, or brush.  · In some cases, fluids (secretions) from the cervix or vagina may also be collected.  This information is not intended to replace advice given to you by your health care provider. Make sure you discuss any questions you have with your health care provider.  Document Released: 08/08/2002 Document Revised: 01/25/2017 Document Reviewed: 01/25/2017  Elsevier Interactive Patient Education © 2019 Elsevier Inc.

## 2018-05-31 LAB — CYTOLOGY - PAP
ADEQUACY: ABSENT
Candida vaginitis: NEGATIVE
Chlamydia: NEGATIVE
DIAGNOSIS: NEGATIVE
HPV: NOT DETECTED
Neisseria Gonorrhea: NEGATIVE
Trichomonas: NEGATIVE

## 2018-06-07 ENCOUNTER — Encounter: Payer: Self-pay | Admitting: Family Medicine

## 2018-06-07 NOTE — Progress Notes (Signed)
Mail letter. 

## 2018-09-06 ENCOUNTER — Telehealth: Payer: Self-pay

## 2018-09-06 NOTE — Telephone Encounter (Signed)
Called patient to do their pre-visit COVID screening.  Have you recently traveled internationally([place], [place], [place], [place], [place]) or within the [place] to a hotspot area([place], [place], [place], [place], [place])? no  Are you currently experiencing any of the following: fever, cough, SHOB, fatigue? no  Have you been in contact with anyone who has recently travelled? no  Have you been in contact with anyone who is experiencing fever, cough, SHOB, fatigue or been diagnosed with COVID  or works in or has recently visited a SNF? no  

## 2018-09-07 ENCOUNTER — Ambulatory Visit (INDEPENDENT_AMBULATORY_CARE_PROVIDER_SITE_OTHER): Payer: BLUE CROSS/BLUE SHIELD | Admitting: Family Medicine

## 2018-09-07 ENCOUNTER — Encounter: Payer: Self-pay | Admitting: Family Medicine

## 2018-09-07 ENCOUNTER — Other Ambulatory Visit: Payer: Self-pay

## 2018-09-07 VITALS — BP 118/77 | HR 78 | Temp 97.9°F | Resp 16 | Ht 68.0 in | Wt 199.6 lb

## 2018-09-07 DIAGNOSIS — Z6830 Body mass index (BMI) 30.0-30.9, adult: Secondary | ICD-10-CM

## 2018-09-07 DIAGNOSIS — Z01818 Encounter for other preprocedural examination: Secondary | ICD-10-CM | POA: Diagnosis not present

## 2018-09-07 DIAGNOSIS — E663 Overweight: Secondary | ICD-10-CM

## 2018-09-07 DIAGNOSIS — Z1389 Encounter for screening for other disorder: Secondary | ICD-10-CM

## 2018-09-07 LAB — POCT URINALYSIS DIP (CLINITEK)
Bilirubin, UA: NEGATIVE
Glucose, UA: NEGATIVE mg/dL
Ketones, POC UA: NEGATIVE mg/dL
Leukocytes, UA: NEGATIVE
Nitrite, UA: NEGATIVE
POC PROTEIN,UA: NEGATIVE
Spec Grav, UA: >=1.03 — AB (ref 1.010–1.025)
Urobilinogen, UA: 0.2 E.U./dL
pH, UA: 5 (ref 5.0–8.0)

## 2018-09-07 NOTE — Progress Notes (Addendum)
Patient ID: Nicole Christian, female    DOB: [birth year], 38 y.o.   MRN: [id number]  PCP: Bing Neighbors, FNP  Chief Complaint  Patient presents with  . Medical Clearance    Subjective:  HPI Nicole Christian is a 38 y.o. female, non-smoker, presents for medical clearance to undergo Liposuction with fat transfer, performed Dr. Lora Paula, MD at [facility] in [place], [place].  Nicole Christian medical history is significant for normal vaginal child birth, Body mass index is 30.35 kg/m.-obesity, and seasonal allergies.  Nicole Christian surgical history is significant for wisdom teeth extraction and tubal ligation.  Nicole Christian denies any recent changes in health since her annual complete physical which was completed on 04/25/2018 without any abnormal findings. She is currently not taking any medication. Patient's last menstrual period was 09/04/2018. She denies any prior issues with anesthesia during her prior surgical procedures. She denies any other concerns today. Social History   Socioeconomic History  . Marital status: Single    Spouse name: Not on file  . Number of children: Not on file  . Years of education: Not on file  . Highest education level: Not on file  Occupational History  . Not on file  Social Needs  . Financial resource strain: Not on file  . Food insecurity:    Worry: Not on file    Inability: Not on file  . Transportation needs:    Medical: Not on file    Non-medical: Not on file  Tobacco Use  . Smoking status: Never Smoker  . Smokeless tobacco: Never Used  Substance and Sexual Activity  . Alcohol use: Yes    Alcohol/week: 1.0 standard drinks    Types: 1 Cans of beer per week    Comment: occasional  . Drug use: No  . Sexual activity: Yes    Birth control/protection: None    Comment: tubal  Lifestyle  . Physical activity:    Days per week: Not on file    Minutes per session: Not on file  . Stress: Not on file   Relationships  . Social connections:    Talks on phone: Not on file    Gets together: Not on file    Attends religious service: Not on file    Active member of club or organization: Not on file    Attends meetings of clubs or organizations: Not on file    Relationship status: Not on file  . Intimate partner violence:    Fear of current or ex partner: Not on file    Emotionally abused: Not on file    Physically abused: Not on file    Forced sexual activity: Not on file  Other Topics Concern  . Not on file  Social History Narrative  . Not on file    Family History  Problem Relation Age of Onset  . Heart disease Maternal Grandmother        died from heart attack  . Heart attack Maternal Grandmother   . Hypertension Mother   . Diabetes Mother   . Hypertension Maternal Aunt   . Cancer Maternal Grandfather        lymph  . Other Neg Hx      Review of Systems  Constitutional: Negative.   HENT: Negative.   Eyes: Negative.   Respiratory: Negative.   Cardiovascular: Negative.   Gastrointestinal: Negative.   Endocrine: Negative.   Genitourinary: Negative.   Musculoskeletal: Negative.   Skin: Negative.   Allergic/Immunologic: Negative.  Neurological: Negative.   Hematological: Negative.   Psychiatric/Behavioral: Negative.    No Known Allergies  Prior to Admission medications   Not on File    Past Medical, Surgical Family and Social History reviewed and updated.    Objective:   Today's Vitals   09/07/18 1409  BP: 118/77  Pulse: 78  Resp: 16  Temp: 97.9 F (36.6 C)  TempSrc: Oral  SpO2: 96%  Weight: 199 lb 9.6 oz (90.5 kg)  Height: 5\' 8"  (1.727 m)    Wt Readings from Last 3 Encounters:  09/07/18 199 lb 9.6 oz (90.5 kg)  05/27/18 193 lb (87.5 kg)  04/25/18 194 lb (88 kg)   Physical Exam Constitutional: Patient appears well-developed and well-nourished. No distress. HENT: Normocephalic, atraumatic, External right and left ear normal. Oropharynx is  clear and moist.  Eyes: Conjunctivae and EOM are normal. PERRLA, no scleral icterus. Neck: Normal ROM. Neck supple. No JVD. No tracheal deviation. No thyromegaly. CVS: RRR, S1/S2 +, no murmurs, no gallops, no carotid bruit.  Pulmonary: Effort and breath sounds normal, no stridor, rhonchi, wheezes, rales.  Abdominal: Soft. BS +, no distension, tenderness, rebound or guarding.  Musculoskeletal: Normal range of motion. No edema and no tenderness.  Lymphadenopathy: No lymphadenopathy noted, cervical, inguinal or axillary Neuro: Alert. Normal reflexes, muscle tone coordination. No cranial nerve deficit. Skin: Skin is warm and dry. No rash noted. Not diaphoretic. No erythema. No pallor. Psychiatric: Normal mood and affect. Behavior, judgment, thought content normal.  Assessment & Plan:  1. Preoperative clearance -Pending receipt of normal labs, patient is medically cleared for procedure. -Upon receipt of labs, will fax medical clearance form and lab results to  Dr. Lora Paulalanrewaju Fasusi, MD.  -No EKG performed as not required by medical clearance form.  Medical clearance lab requirements: - Beta hCG quant (ref lab) - HIV antibody (with reflex) - POCT URINALYSIS DIP (CLINITEK) - CBC with Differential - Protime-INR - APTT - Comprehensive metabolic panel     Joaquin CourtsKimberly Gwenetta Devos, FNP [facility] [address], [place] [id number] [phone]: [phone]

## 2018-09-08 LAB — PROTIME-INR
INR: 1 (ref 0.8–1.2)
Prothrombin Time: 10.9 s (ref 9.1–12.0)

## 2018-09-08 LAB — COMPREHENSIVE METABOLIC PANEL
ALT: 14 IU/L (ref 0–32)
AST: 14 IU/L (ref 0–40)
Albumin/Globulin Ratio: 1.6 (ref 1.2–2.2)
Albumin: 4.6 g/dL (ref 3.8–4.8)
Alkaline Phosphatase: 52 IU/L (ref 39–117)
BUN/Creatinine Ratio: 11 (ref 9–23)
BUN: 9 mg/dL (ref 6–20)
Bilirubin Total: 0.8 mg/dL (ref 0.0–1.2)
CO2: 20 mmol/L (ref 20–29)
Calcium: 9.9 mg/dL (ref 8.7–10.2)
Chloride: 101 mmol/L (ref 96–106)
Creatinine, Ser: 0.8 mg/dL (ref 0.57–1.00)
GFR calc Af Amer: 110 mL/min/{1.73_m2} (ref >59)
GFR calc non Af Amer: 95 mL/min/{1.73_m2} (ref >59)
Globulin, Total: 2.9 g/dL (ref 1.5–4.5)
Glucose: 77 mg/dL (ref 65–99)
Potassium: 4.4 mmol/L (ref 3.5–5.2)
Sodium: 140 mmol/L (ref 134–144)
Total Protein: 7.5 g/dL (ref 6.0–8.5)

## 2018-09-08 LAB — CBC WITH DIFFERENTIAL/PLATELET
Basophils Absolute: 0 10*3/uL (ref 0.0–0.2)
Basos: 0 %
EOS (ABSOLUTE): 0.1 10*3/uL (ref 0.0–0.4)
Eos: 2 %
Hematocrit: 37.7 % (ref 34.0–46.6)
Hemoglobin: 13.1 g/dL (ref 11.1–15.9)
Immature Grans (Abs): 0 10*3/uL (ref 0.0–0.1)
Immature Granulocytes: 0 %
Lymphocytes Absolute: 2 10*3/uL (ref 0.7–3.1)
Lymphs: 39 %
MCH: 29.7 pg (ref 26.6–33.0)
MCHC: 34.7 g/dL (ref 31.5–35.7)
MCV: 86 fL (ref 79–97)
Monocytes Absolute: 0.4 10*3/uL (ref 0.1–0.9)
Monocytes: 7 %
Neutrophils Absolute: 2.8 10*3/uL (ref 1.4–7.0)
Neutrophils: 52 %
Platelets: 352 10*3/uL (ref 150–450)
RBC: 4.41 x10E6/uL (ref 3.77–5.28)
RDW: 13.4 % (ref 11.7–15.4)
WBC: 5.3 10*3/uL (ref 3.4–10.8)

## 2018-09-08 LAB — BETA HCG QUANT (REF LAB): hCG Quant: <1 m[IU]/mL

## 2018-09-08 LAB — HIV ANTIBODY (ROUTINE TESTING W REFLEX): HIV Screen 4th Generation wRfx: NONREACTIVE

## 2018-09-08 LAB — APTT: aPTT: 28 s (ref 24–33)

## 2018-09-14 ENCOUNTER — Encounter: Payer: Self-pay | Admitting: Family Medicine

## 2018-09-14 ENCOUNTER — Telehealth: Payer: Self-pay | Admitting: Family Medicine

## 2018-09-14 NOTE — Telephone Encounter (Signed)
Received "no answer" error when faxing from both fax machines in office. Will call [facility] to see if they have an alternate fax number.

## 2018-09-14 NOTE — Telephone Encounter (Signed)
Faxed medical clearance form, office note & lab results to [facility]. Awaiting confirmation.

## 2018-09-14 NOTE — Telephone Encounter (Signed)
Receptionist states that fax machine wasn't plugged in. Resent the fax & received confirmation. Returned a copy of confirmation & medical clearance form to provider. [employer] to be scanned in patient's chart.

## 2018-09-14 NOTE — Telephone Encounter (Signed)
Please print all lab result from 09/07/18 visit, including point of care test, my office note, and fax all document along with the medical clearance form to  [facility] [phone].  Please return a copy of medical clearance form and and confirmation to provider.

## 2018-09-22 ENCOUNTER — Encounter: Payer: Self-pay | Admitting: Family Medicine

## 2019-05-24 ENCOUNTER — Telehealth: Payer: BLUE CROSS/BLUE SHIELD | Admitting: Family

## 2019-05-24 DIAGNOSIS — Z20822 Contact with and (suspected) exposure to covid-19: Secondary | ICD-10-CM

## 2019-05-24 DIAGNOSIS — J00 Acute nasopharyngitis [common cold]: Secondary | ICD-10-CM | POA: Diagnosis not present

## 2019-05-24 MED ORDER — BENZONATATE 100 MG PO CAPS: 100.0000 mg | ORAL_CAPSULE | Freq: Three times a day (TID) | ORAL | 0 refills | Status: DC | PRN

## 2019-05-24 NOTE — Progress Notes (Signed)
E-Visit for Corona Virus Screening   Your current symptoms could be consistent with the coronavirus.  Many health care providers can now test patients at their office but not all are.  [facility] has multiple testing sites. For information on our COVID testing locations and hours go to [email or web address]  We are enrolling you in our MyChart Home Monitoring for COVID19 . Daily you will receive a questionnaire within the MyChart website. Our COVID 19 response team will be monitoring your responses daily.  Testing Information: The COVID-19 Community Testing sites will begin testing BY APPOINTMENT ONLY.  You can schedule online at [email or web address]  If you do not have access to a smart phone or computer you may call [phone] for an appointment.  Testing Locations: Appointment schedule is 8 am to 3:30 pm at all sites  [place] indoors at [address], [place] [address] [facility]  indoors at [address] Visitors Entrance, [place], [place] [address] [place] indoors at [address], [place] [address]  Additional testing sites in the Community:  . For [facility] Testing sites in [place]  https://www.cvs.com/minuteclinic/covid-19-testing  . For Pop-up testing sites in [place]  https://covid19.ncdhhs.gov/about-covid-19/testing/find-my-testing-place/pop-testing-sites  . For Testing sites with regular hours https://onsms.org/[place]/  . For [place] [facility] https://[facility]med[facility].com/covid-19-community-outreach-testing/  . For [facility] https://www.[place].gov[place]/human-services/health-department/coronavirus-covid-19-info/covid-19-testing  . For [place] testing in [place] and [place] https://www.[place].gov/[place]/human-services/health-department/coronavirus-covid-19-info/covid-19-testing  . For [facility] testing in [place]   https://lhi.care/covidtesting  For  more  information about community testing call [phone]   We are enrolling you in our MyChart Home Monitoring for COVID19 . Daily you will receive a questionnaire within the MyChart website. Our COVID 19 response team will be monitoring your responses daily.  Please quarantine yourself while awaiting your test results. If you develop fever/cough/breathlessness, please stay home for 10 days with improving symptoms and until you have had 24 hours of no fever (without taking a fever reducer).  You should wear a mask or cloth face covering over your nose and mouth if you must be around other people or animals, including pets (even at home). Try to stay at least 6 feet away from other people. This will protect the people around you.  Please continue good preventive care measures, including:  frequent hand-washing, avoid touching your face, cover coughs/sneezes, stay out of crowds and keep a 6 foot distance from others.  COVID-19 is a respiratory illness with symptoms that are similar to the flu. Symptoms are typically mild to moderate, but there have been cases of severe illness and death due to the virus.   The following symptoms may appear 2-14 days after exposure: . Fever . Cough . Shortness of breath or difficulty breathing . Chills . Repeated shaking with chills . Muscle pain . Headache . Sore throat . New loss of taste or smell . Fatigue . Congestion or runny nose . Nausea or vomiting . Diarrhea  Go to the nearest hospital ED for assessment if fever/cough/breathlessness are severe or illness seems like a threat to life.  It is vitally important that if you feel that you have an infection such as this virus or any other virus that you stay home and away from places where you may spread it to others.  You should avoid contact with people age 65 and older.   You can use medication such as A prescription cough medication called Tessalon Perles 100 mg. You may take 1-2 capsules every 8 hours as    needed for cough.  You may also take acetaminophen (Tylenol) as needed for fever.  Reduce your risk of any infection by using the same precautions used for avoiding the common cold or flu:  . Wash your hands often with soap and warm water for at least 20 seconds.  If soap and water are not readily available, use an alcohol-based hand sanitizer with at least 60% alcohol.  . If coughing or sneezing, cover your mouth and nose by coughing or sneezing into the elbow areas of your shirt or coat, into a tissue or into your sleeve (not your hands). . Avoid shaking hands with others and consider head nods or verbal greetings only. . Avoid touching your eyes, nose, or mouth with unwashed hands.  . Avoid close contact with people who are sick. . Avoid places or events with large numbers of people in one location, like concerts or sporting events. . Carefully consider travel plans you have or are making. . If you are planning any travel outside or inside the [place], visit the CDC's Travelers' Health webpage for the latest health notices. . If you have some symptoms but not all symptoms, continue to monitor at home and seek medical attention if your symptoms worsen. . If you are having a medical emergency, call 911.  HOME CARE . Only take medications as instructed by your medical team. . Drink plenty of fluids and get plenty of rest. . A steam or ultrasonic humidifier can help if you have congestion.   GET HELP RIGHT AWAY IF YOU HAVE EMERGENCY WARNING SIGNS** FOR COVID-19. If you or someone is showing any of these signs seek emergency medical care immediately. Call 911 or proceed to your closest emergency facility if: . You develop worsening high fever. . Trouble breathing . Bluish lips or face . Persistent pain or pressure in the chest . New confusion . Inability to wake or stay awake . You cough up blood. . Your symptoms become more severe  **This list is not all possible symptoms. Contact your  medical provider for any symptoms that are sever or concerning to you.  MAKE SURE YOU   Understand these instructions.  Will watch your condition.  Will get help right away if you are not doing well or get worse.  Your e-visit answers were reviewed by a board certified advanced clinical practitioner to complete your personal care plan.  Depending on the condition, your plan could have included both over the counter or prescription medications.  If there is a problem please reply once you have received a response from your provider.  Your safety is important to us.  If you have drug allergies check your prescription carefully.    You can use MyChart to ask questions about today's visit, request a non-urgent call back, or ask for a work or school excuse for 24 hours related to this e-Visit. If it has been greater than 24 hours you will need to follow up with your provider, or enter a new e-Visit to address those concerns. You will get an e-mail in the next two days asking about your experience.  I hope that your e-visit has been valuable and will speed your recovery. Thank you for using e-visits.  Approximately 5 minutes was spent documenting and reviewing patient's chart.    

## 2019-05-25 ENCOUNTER — Ambulatory Visit: Payer: BC Managed Care – PPO | Attending: Internal Medicine

## 2019-05-25 DIAGNOSIS — Z20822 Contact with and (suspected) exposure to covid-19: Secondary | ICD-10-CM

## 2019-05-25 DIAGNOSIS — Z20828 Contact with and (suspected) exposure to other viral communicable diseases: Secondary | ICD-10-CM | POA: Diagnosis not present

## 2019-05-27 LAB — NOVEL CORONAVIRUS, NAA: SARS-CoV-2, NAA: NOT DETECTED

## 2019-06-01 ENCOUNTER — Ambulatory Visit: Payer: BC Managed Care – PPO | Attending: Internal Medicine

## 2019-06-01 DIAGNOSIS — J00 Acute nasopharyngitis [common cold]: Secondary | ICD-10-CM | POA: Diagnosis not present

## 2019-09-18 ENCOUNTER — Encounter: Payer: Self-pay | Admitting: Family Medicine

## 2019-10-05 ENCOUNTER — Telehealth: Payer: Self-pay

## 2019-10-05 NOTE — Telephone Encounter (Signed)
Called patient to do their pre-visit COVID screening.  Call went to voicemail. Unable to do prescreening.  

## 2019-10-06 ENCOUNTER — Encounter: Payer: BC Managed Care – PPO | Admitting: Internal Medicine

## 2019-10-11 DIAGNOSIS — J069 Acute upper respiratory infection, unspecified: Secondary | ICD-10-CM | POA: Diagnosis not present

## 2019-10-23 ENCOUNTER — Telehealth: Payer: Self-pay

## 2019-10-23 NOTE — Patient Instructions (Signed)
Thank you for choosing Primary Care at [facility] to be your medical home!    Nicole Christian was seen by De Hollingshead, DO today.   Nicole Christian's primary care provider is Marcy Siren, DO.   For the best care possible, you should try to see Marcy Siren, DO whenever you come to the clinic.   We look forward to seeing you again soon!  If you have any questions about your visit today, please call us at [phone] or feel free to reach your primary care provider via MyChart.   Keeping You Healthy  Get These Tests 1. Blood Pressure- Have your blood pressure checked once a year by your health care provider.  Normal blood pressure is 120/80. 2. Weight- Have your body mass index (BMI) calculated to screen for obesity.  BMI is measure of body fat based on height and weight.  You can also calculate your own BMI at [email or web address]://[email or web address].west-esparza.[email or web address] 3. Cholesterol- Have your cholesterol checked every 5 years starting at age 34 then yearly starting at age 76. 4. Chlamydia, HIV, and other sexually transmitted diseases- Get screened every year until age 29, then within three months of each new sexual provider. 5. Pap Test - Every 1-5 years; discuss with your health care provider. 6. Mammogram- Every 1-2 years starting at age 46--50  Take these medicines  Calcium with Vitamin D-Your body needs 1200 mg of Calcium each day and [phone] IU of Vitamin D daily.  Your body can only absorb 500 mg of Calcium at a time so Calcium must be taken in 2 or 3 divided doses throughout the day.  Multivitamin with folic acid- Once daily if it is possible for you to become pregnant.  Get these Immunizations  Gardasil-Series of three doses; prevents HPV related illness such as genital warts and cervical cancer.  Menactra-Single dose; prevents meningitis.  Tetanus shot- Every 10 years.  Flu shot-Every year.  Take these steps 1. Do not smoke-Your healthcare provider can help you  quit.  For tips on how to quit go to www.smokefree.gov or call 1-800 QUITNOW. 2. Be physically active- Exercise 5 days a week for at least 30 minutes.  If you are not already physically active, start slow and gradually work up to 30 minutes of moderate physical activity.  Examples of moderate activity include walking briskly, dancing, swimming, bicycling, etc. 3. Breast Cancer- A self breast exam every month is important for early detection of breast cancer.  For more information and instruction on self breast exams, ask your healthcare provider or [email or web address]. 4. Eat a healthy diet- Eat a variety of healthy foods such as fruits, vegetables, whole grains, low fat milk, low fat cheeses, yogurt, lean meats, poultry and fish, beans, nuts, tofu, etc.  For more information go to www. Thenutritionsource.org 5. Drink alcohol in moderation- Limit alcohol intake to one drink or less per day. Never drink and drive. 6. Depression- Your emotional health is as important as your physical health.  If you're feeling down or losing interest in things you normally enjoy please talk to your healthcare provider about being screened for depression. 7. Dental visit- Brush and floss your teeth twice daily; visit your dentist twice a year. 8. Eye doctor- Get an eye exam at least every 2 years. 9. Helmet use- Always wear a helmet when riding a bicycle, motorcycle, rollerblading or skateboarding. 10. Safe sex- If you may be exposed to sexually transmitted infections, use a condom. 11. Seat belts- Seat belts can save  your live; always wear one. 12. Smoke/Carbon Monoxide detectors- These detectors need to be installed on the appropriate level of your home. Replace batteries at least once a year. 13. Skin cancer- When out in the sun please cover up and use sunscreen 15 SPF or higher. 14. Violence- If anyone is threatening or hurting you, please tell your healthcare provider.

## 2019-10-23 NOTE — Telephone Encounter (Signed)
Called patient to do their pre-visit COVID screening.  Call went to voicemail. Unable to do prescreening.  

## 2019-10-24 ENCOUNTER — Other Ambulatory Visit: Payer: Self-pay

## 2019-10-24 ENCOUNTER — Other Ambulatory Visit (HOSPITAL_COMMUNITY)
Admission: RE | Admit: 2019-10-24 | Discharge: 2019-10-24 | Disposition: A | Discharge status: Home / Self Care | Payer: BC Managed Care – PPO | Source: Ambulatory Visit | Attending: Internal Medicine | Admitting: Internal Medicine

## 2019-10-24 ENCOUNTER — Encounter: Payer: Self-pay | Admitting: Internal Medicine

## 2019-10-24 ENCOUNTER — Ambulatory Visit (INDEPENDENT_AMBULATORY_CARE_PROVIDER_SITE_OTHER): Payer: BC Managed Care – PPO | Admitting: Internal Medicine

## 2019-10-24 VITALS — BP 126/81 | HR 70 | Temp 97.5°F | Resp 17 | Ht 68.0 in | Wt 213.0 lb

## 2019-10-24 DIAGNOSIS — Z113 Encounter for screening for infections with a predominantly sexual mode of transmission: Secondary | ICD-10-CM | POA: Diagnosis not present

## 2019-10-24 DIAGNOSIS — Z7689 Persons encountering health services in other specified circumstances: Secondary | ICD-10-CM

## 2019-10-24 DIAGNOSIS — Z0000 Encounter for general adult medical examination without abnormal findings: Secondary | ICD-10-CM

## 2019-10-24 DIAGNOSIS — Z131 Encounter for screening for diabetes mellitus: Secondary | ICD-10-CM | POA: Diagnosis not present

## 2019-10-24 NOTE — Progress Notes (Signed)
  Subjective:    Nicole Christian - 39 y.o. female MRN [id number]  Date of birth: [birth year]  HPI  Nicole Christian is here for annual exam. Requests STD screening. No concerns today.      Health Maintenance:  Health Maintenance Due  Topic Date Due  . COVID-19 Vaccine (1) Never done    -  reports that she has never smoked. She has never used smokeless tobacco. - Review of Systems: Per HPI. - Past Medical History: Patient Active Problem List   Diagnosis Date Noted  . Musculoskeletal pain 04/01/2012   - Medications: reviewed and updated   Objective:   Physical Exam BP 126/81   Pulse 70   Temp (!) 97.5 F (36.4 C) (Temporal)   Resp 17   Wt 213 lb (96.6 kg)   LMP 10/04/2019 (Exact Date)   SpO2 97%   BMI 32.39 kg/m  Physical Exam  Constitutional: She is oriented to person, place, and time and well-developed, well-nourished, and in no distress.  HENT:  Head: Normocephalic and atraumatic.  Mouth/Throat: Oropharynx is clear and moist.  TMs normal bilaterally   Eyes: Pupils are equal, round, and reactive to light. Conjunctivae and EOM are normal.  Neck: No thyromegaly present.  Cardiovascular: Normal rate, regular rhythm, normal heart sounds and intact distal pulses.  No murmur heard. Pulmonary/Chest: Effort normal and breath sounds normal. No respiratory distress. She has no wheezes.  Abdominal: Soft. Bowel sounds are normal. She exhibits no distension. There is no abdominal tenderness. There is no rebound and no guarding.  Musculoskeletal:        General: No deformity or edema. Normal range of motion.     Cervical back: Normal range of motion and neck supple.  Lymphadenopathy:    She has no cervical adenopathy.  Neurological: She is alert and oriented to person, place, and time. Gait normal.  Skin: Skin is warm and dry. No rash noted. She is not diaphoretic.  Psychiatric: Mood, affect and judgment normal.           Assessment & Plan:   1.  Encounter to establish care Reviewed patient's PMH, social history, surgical history, and medications.    2. Annual physical exam Counseled on 150 minutes of exercise per week, healthy eating (including decreased daily intake of saturated fats, cholesterol, added sugars, sodium), STI prevention, routine healthcare maintenance. Up to date on cervical cancer screening.  - CBC with Differential - Comprehensive metabolic panel - Lipid Panel  3. Screening for STDs (sexually transmitted diseases) - RPR - Cervicovaginal ancillary only - HIV Antibody (routine testing w rflx)    Marcy Siren, D.O. 10/24/2019, 9:37 AM Primary Care at [facility]

## 2019-10-25 ENCOUNTER — Other Ambulatory Visit: Payer: Self-pay | Admitting: Internal Medicine

## 2019-10-25 DIAGNOSIS — E782 Mixed hyperlipidemia: Secondary | ICD-10-CM | POA: Insufficient documentation

## 2019-10-25 LAB — CBC WITH DIFFERENTIAL/PLATELET
Basophils Absolute: 0 10*3/uL (ref 0.0–0.2)
Basos: 0 %
EOS (ABSOLUTE): 0.1 10*3/uL (ref 0.0–0.4)
Eos: 2 %
Hematocrit: 39.1 % (ref 34.0–46.6)
Hemoglobin: 13.1 g/dL (ref 11.1–15.9)
Immature Grans (Abs): 0 10*3/uL (ref 0.0–0.1)
Immature Granulocytes: 0 %
Lymphocytes Absolute: 1.5 10*3/uL (ref 0.7–3.1)
Lymphs: 32 %
MCH: 29.4 pg (ref 26.6–33.0)
MCHC: 33.5 g/dL (ref 31.5–35.7)
MCV: 88 fL (ref 79–97)
Monocytes Absolute: 0.4 10*3/uL (ref 0.1–0.9)
Monocytes: 8 %
Neutrophils Absolute: 2.7 10*3/uL (ref 1.4–7.0)
Neutrophils: 58 %
Platelets: 315 10*3/uL (ref 150–450)
RBC: 4.45 x10E6/uL (ref 3.77–5.28)
RDW: 13.9 % (ref 11.7–15.4)
WBC: 4.7 10*3/uL (ref 3.4–10.8)

## 2019-10-25 LAB — CERVICOVAGINAL ANCILLARY ONLY
Bacterial Vaginitis (gardnerella): POSITIVE — AB
Candida Glabrata: NEGATIVE
Candida Vaginitis: NEGATIVE
Chlamydia: NEGATIVE
Comment: NEGATIVE
Comment: NEGATIVE
Comment: NEGATIVE
Comment: NEGATIVE
Comment: NEGATIVE
Comment: NORMAL
Neisseria Gonorrhea: NEGATIVE
Trichomonas: POSITIVE — AB

## 2019-10-25 LAB — COMPREHENSIVE METABOLIC PANEL
ALT: 24 IU/L (ref 0–32)
AST: 19 IU/L (ref 0–40)
Albumin/Globulin Ratio: 1.6 (ref 1.2–2.2)
Albumin: 4.3 g/dL (ref 3.8–4.8)
Alkaline Phosphatase: 52 IU/L (ref 48–121)
BUN/Creatinine Ratio: 12 (ref 9–23)
BUN: 7 mg/dL (ref 6–20)
Bilirubin Total: 0.5 mg/dL (ref 0.0–1.2)
CO2: 18 mmol/L — ABNORMAL LOW (ref 20–29)
Calcium: 9.3 mg/dL (ref 8.7–10.2)
Chloride: 107 mmol/L — ABNORMAL HIGH (ref 96–106)
Creatinine, Ser: 0.58 mg/dL (ref 0.57–1.00)
GFR calc Af Amer: 136 mL/min/{1.73_m2} (ref >59)
GFR calc non Af Amer: 118 mL/min/{1.73_m2} (ref >59)
Globulin, Total: 2.7 g/dL (ref 1.5–4.5)
Glucose: 127 mg/dL — ABNORMAL HIGH (ref 65–99)
Potassium: 4.3 mmol/L (ref 3.5–5.2)
Sodium: 138 mmol/L (ref 134–144)
Total Protein: 7 g/dL (ref 6.0–8.5)

## 2019-10-25 LAB — LIPID PANEL
Chol/HDL Ratio: 5.3 ratio — ABNORMAL HIGH (ref 0.0–4.4)
Cholesterol, Total: 222 mg/dL — ABNORMAL HIGH (ref 100–199)
HDL: 42 mg/dL (ref >39)
LDL Chol Calc (NIH): 134 mg/dL — ABNORMAL HIGH (ref 0–99)
Triglycerides: 255 mg/dL — ABNORMAL HIGH (ref 0–149)
VLDL Cholesterol Cal: 46 mg/dL — ABNORMAL HIGH (ref 5–40)

## 2019-10-25 LAB — RPR: RPR Ser Ql: NONREACTIVE

## 2019-10-25 LAB — HIV ANTIBODY (ROUTINE TESTING W REFLEX): HIV Screen 4th Generation wRfx: NONREACTIVE

## 2019-10-25 MED ORDER — METRONIDAZOLE 500 MG PO TABS
500.0000 mg | ORAL_TABLET | Freq: Two times a day (BID) | ORAL | 0 refills | Status: DC
Start: 2019-10-25 — End: 2020-01-13

## 2019-10-26 DIAGNOSIS — J069 Acute upper respiratory infection, unspecified: Secondary | ICD-10-CM | POA: Diagnosis not present

## 2019-10-26 DIAGNOSIS — J0110 Acute frontal sinusitis, unspecified: Secondary | ICD-10-CM | POA: Diagnosis not present

## 2019-11-01 LAB — SPECIMEN STATUS REPORT

## 2019-11-01 LAB — HGB A1C W/O EAG: Hgb A1c MFr Bld: 5.5 % (ref 4.8–5.6)

## 2019-11-01 NOTE — Progress Notes (Signed)
Patient notified of results & recommendations. Expressed understanding.

## 2019-11-09 DIAGNOSIS — M5386 Other specified dorsopathies, lumbar region: Secondary | ICD-10-CM | POA: Diagnosis not present

## 2019-11-09 DIAGNOSIS — M9903 Segmental and somatic dysfunction of lumbar region: Secondary | ICD-10-CM | POA: Diagnosis not present

## 2019-11-09 DIAGNOSIS — M531 Cervicobrachial syndrome: Secondary | ICD-10-CM | POA: Diagnosis not present

## 2019-11-09 DIAGNOSIS — M9901 Segmental and somatic dysfunction of cervical region: Secondary | ICD-10-CM | POA: Diagnosis not present

## 2019-11-29 ENCOUNTER — Telehealth: Payer: Self-pay | Admitting: Internal Medicine

## 2019-11-29 NOTE — Telephone Encounter (Signed)
Patient called saying she dropped off a clearance form last Friday and would like to know the status of the form. Please f/u

## 2019-11-29 NOTE — Telephone Encounter (Signed)
Form has been given to Dr. Earlene Plater to see if she can fill it out based off of her recent physical. Earlene Plater has not returned the paperwork yet.  If patient form can be completed based off of recent visit she will be called to inform her of that & to schedule a lab visit for the additional labs requested by the surgeon's office. If form cannot be completed based off of recent visit she will be called to schedule a surgical clearance appointment.

## 2019-12-01 ENCOUNTER — Other Ambulatory Visit: Payer: Self-pay | Admitting: Internal Medicine

## 2019-12-05 ENCOUNTER — Other Ambulatory Visit: Payer: Self-pay

## 2019-12-05 DIAGNOSIS — Z01812 Encounter for preprocedural laboratory examination: Secondary | ICD-10-CM

## 2019-12-05 DIAGNOSIS — Z1159 Encounter for screening for other viral diseases: Secondary | ICD-10-CM

## 2019-12-06 ENCOUNTER — Other Ambulatory Visit: Payer: Self-pay

## 2019-12-06 ENCOUNTER — Other Ambulatory Visit (INDEPENDENT_AMBULATORY_CARE_PROVIDER_SITE_OTHER): Payer: BC Managed Care – PPO

## 2019-12-06 DIAGNOSIS — Z01812 Encounter for preprocedural laboratory examination: Secondary | ICD-10-CM

## 2019-12-06 DIAGNOSIS — Z1159 Encounter for screening for other viral diseases: Secondary | ICD-10-CM | POA: Diagnosis not present

## 2019-12-07 LAB — COMPREHENSIVE METABOLIC PANEL
ALT: 20 IU/L (ref 0–32)
AST: 14 IU/L (ref 0–40)
Albumin/Globulin Ratio: 1.5 (ref 1.2–2.2)
Albumin: 3.9 g/dL (ref 3.8–4.8)
Alkaline Phosphatase: 46 IU/L — ABNORMAL LOW (ref 48–121)
BUN/Creatinine Ratio: 14 (ref 9–23)
BUN: 9 mg/dL (ref 6–20)
Bilirubin Total: 0.4 mg/dL (ref 0.0–1.2)
CO2: 23 mmol/L (ref 20–29)
Calcium: 9.2 mg/dL (ref 8.7–10.2)
Chloride: 108 mmol/L — ABNORMAL HIGH (ref 96–106)
Creatinine, Ser: 0.65 mg/dL (ref 0.57–1.00)
GFR calc Af Amer: 131 mL/min/{1.73_m2} (ref >59)
GFR calc non Af Amer: 114 mL/min/{1.73_m2} (ref >59)
Globulin, Total: 2.6 g/dL (ref 1.5–4.5)
Glucose: 97 mg/dL (ref 65–99)
Potassium: 4.4 mmol/L (ref 3.5–5.2)
Sodium: 144 mmol/L (ref 134–144)
Total Protein: 6.5 g/dL (ref 6.0–8.5)

## 2019-12-07 LAB — CBC WITH DIFFERENTIAL/PLATELET
Basophils Absolute: 0 10*3/uL (ref 0.0–0.2)
Basos: 0 %
EOS (ABSOLUTE): 0.1 10*3/uL (ref 0.0–0.4)
Eos: 2 %
Hematocrit: 37.2 % (ref 34.0–46.6)
Hemoglobin: 12.1 g/dL (ref 11.1–15.9)
Immature Grans (Abs): 0 10*3/uL (ref 0.0–0.1)
Immature Granulocytes: 0 %
Lymphocytes Absolute: 1.9 10*3/uL (ref 0.7–3.1)
Lymphs: 38 %
MCH: 29.1 pg (ref 26.6–33.0)
MCHC: 32.5 g/dL (ref 31.5–35.7)
MCV: 89 fL (ref 79–97)
Monocytes Absolute: 0.4 10*3/uL (ref 0.1–0.9)
Monocytes: 9 %
Neutrophils Absolute: 2.6 10*3/uL (ref 1.4–7.0)
Neutrophils: 51 %
Platelets: 293 10*3/uL (ref 150–450)
RBC: 4.16 x10E6/uL (ref 3.77–5.28)
RDW: 13.7 % (ref 11.7–15.4)
WBC: 5 10*3/uL (ref 3.4–10.8)

## 2019-12-07 LAB — PROTIME-INR
INR: 1 (ref 0.9–1.2)
Prothrombin Time: 10.8 s (ref 9.1–12.0)

## 2019-12-07 LAB — HEPATITIS C ANTIBODY: Hep C Virus Ab: <0.1 s/co ratio (ref 0.0–0.9)

## 2019-12-07 LAB — HCG, SERUM, QUALITATIVE: hCG,Beta Subunit,Qual,Serum: NEGATIVE m[IU]/mL (ref <6)

## 2019-12-07 LAB — HIV ANTIBODY (ROUTINE TESTING W REFLEX): HIV Screen 4th Generation wRfx: NONREACTIVE

## 2019-12-07 NOTE — Progress Notes (Signed)
Patient notified of results & recommendations. Expressed understanding.

## 2019-12-08 DIAGNOSIS — Z01812 Encounter for preprocedural laboratory examination: Secondary | ICD-10-CM | POA: Diagnosis not present

## 2019-12-20 DIAGNOSIS — Z01818 Encounter for other preprocedural examination: Secondary | ICD-10-CM | POA: Diagnosis not present

## 2019-12-20 DIAGNOSIS — Z01812 Encounter for preprocedural laboratory examination: Secondary | ICD-10-CM | POA: Diagnosis not present

## 2020-01-10 ENCOUNTER — Emergency Department (HOSPITAL_COMMUNITY): Payer: BC Managed Care – PPO

## 2020-01-10 ENCOUNTER — Other Ambulatory Visit: Payer: Self-pay

## 2020-01-10 ENCOUNTER — Encounter (HOSPITAL_COMMUNITY): Payer: Self-pay | Admitting: Emergency Medicine

## 2020-01-10 ENCOUNTER — Inpatient Hospital Stay (HOSPITAL_COMMUNITY)
Admission: EM | Admit: 2020-01-10 | Discharge: 2020-01-13 | DRG: 862 | Disposition: A | Discharge status: Home / Self Care | Payer: BC Managed Care – PPO | Attending: Internal Medicine | Admitting: Internal Medicine

## 2020-01-10 DIAGNOSIS — A4101 Sepsis due to Methicillin susceptible Staphylococcus aureus: Secondary | ICD-10-CM | POA: Diagnosis present

## 2020-01-10 DIAGNOSIS — T148XXA Other injury of unspecified body region, initial encounter: Secondary | ICD-10-CM

## 2020-01-10 DIAGNOSIS — T8141XA Infection following a procedure, superficial incisional surgical site, initial encounter: Principal | ICD-10-CM | POA: Diagnosis present

## 2020-01-10 DIAGNOSIS — Z8249 Family history of ischemic heart disease and other diseases of the circulatory system: Secondary | ICD-10-CM | POA: Diagnosis not present

## 2020-01-10 DIAGNOSIS — L089 Local infection of the skin and subcutaneous tissue, unspecified: Secondary | ICD-10-CM | POA: Diagnosis not present

## 2020-01-10 DIAGNOSIS — E669 Obesity, unspecified: Secondary | ICD-10-CM | POA: Diagnosis present

## 2020-01-10 DIAGNOSIS — E869 Volume depletion, unspecified: Secondary | ICD-10-CM | POA: Diagnosis present

## 2020-01-10 DIAGNOSIS — R109 Unspecified abdominal pain: Secondary | ICD-10-CM | POA: Diagnosis not present

## 2020-01-10 DIAGNOSIS — Z20822 Contact with and (suspected) exposure to covid-19: Secondary | ICD-10-CM | POA: Diagnosis present

## 2020-01-10 DIAGNOSIS — E871 Hypo-osmolality and hyponatremia: Secondary | ICD-10-CM | POA: Diagnosis present

## 2020-01-10 DIAGNOSIS — L03311 Cellulitis of abdominal wall: Secondary | ICD-10-CM | POA: Diagnosis not present

## 2020-01-10 DIAGNOSIS — T8149XA Infection following a procedure, other surgical site, initial encounter: Secondary | ICD-10-CM

## 2020-01-10 DIAGNOSIS — A419 Sepsis, unspecified organism: Secondary | ICD-10-CM

## 2020-01-10 DIAGNOSIS — Z833 Family history of diabetes mellitus: Secondary | ICD-10-CM | POA: Diagnosis not present

## 2020-01-10 DIAGNOSIS — T8144XA Sepsis following a procedure, initial encounter: Secondary | ICD-10-CM | POA: Diagnosis not present

## 2020-01-10 DIAGNOSIS — Z6832 Body mass index (BMI) 32.0-32.9, adult: Secondary | ICD-10-CM | POA: Diagnosis not present

## 2020-01-10 DIAGNOSIS — D72829 Elevated white blood cell count, unspecified: Secondary | ICD-10-CM | POA: Diagnosis not present

## 2020-01-10 DIAGNOSIS — E872 Acidosis: Secondary | ICD-10-CM | POA: Diagnosis not present

## 2020-01-10 DIAGNOSIS — D649 Anemia, unspecified: Secondary | ICD-10-CM | POA: Diagnosis not present

## 2020-01-10 DIAGNOSIS — Y838 Other surgical procedures as the cause of abnormal reaction of the patient, or of later complication, without mention of misadventure at the time of the procedure: Secondary | ICD-10-CM | POA: Diagnosis not present

## 2020-01-10 DIAGNOSIS — R509 Fever, unspecified: Secondary | ICD-10-CM | POA: Diagnosis not present

## 2020-01-10 LAB — URINALYSIS, ROUTINE W REFLEX MICROSCOPIC
Bilirubin Urine: NEGATIVE
Glucose, UA: NEGATIVE mg/dL
Hgb urine dipstick: NEGATIVE
Ketones, ur: NEGATIVE mg/dL
Leukocytes,Ua: NEGATIVE
Nitrite: NEGATIVE
Protein, ur: 30 mg/dL — AB
Specific Gravity, Urine: 1.017 (ref 1.005–1.030)
pH: 5 (ref 5.0–8.0)

## 2020-01-10 LAB — COMPREHENSIVE METABOLIC PANEL
ALT: 37 U/L (ref 0–44)
AST: 19 U/L (ref 15–41)
Albumin: 3.3 g/dL — ABNORMAL LOW (ref 3.5–5.0)
Alkaline Phosphatase: 74 U/L (ref 38–126)
Anion gap: 11 (ref 5–15)
BUN: 6 mg/dL (ref 6–20)
CO2: 19 mmol/L — ABNORMAL LOW (ref 22–32)
Calcium: 8.9 mg/dL (ref 8.9–10.3)
Chloride: 101 mmol/L (ref 98–111)
Creatinine, Ser: 0.68 mg/dL (ref 0.44–1.00)
GFR calc Af Amer: >60 mL/min (ref >60)
GFR calc non Af Amer: >60 mL/min (ref >60)
Glucose, Bld: 183 mg/dL — ABNORMAL HIGH (ref 70–99)
Potassium: 3.7 mmol/L (ref 3.5–5.1)
Sodium: 131 mmol/L — ABNORMAL LOW (ref 135–145)
Total Bilirubin: 1.5 mg/dL — ABNORMAL HIGH (ref 0.3–1.2)
Total Protein: 7.4 g/dL (ref 6.5–8.1)

## 2020-01-10 LAB — CBC WITH DIFFERENTIAL/PLATELET
Abs Immature Granulocytes: 0.28 10*3/uL — ABNORMAL HIGH (ref 0.00–0.07)
Basophils Absolute: 0 10*3/uL (ref 0.0–0.1)
Basophils Relative: 0 %
Eosinophils Absolute: 0.1 10*3/uL (ref 0.0–0.5)
Eosinophils Relative: 1 %
HCT: 29.7 % — ABNORMAL LOW (ref 36.0–46.0)
Hemoglobin: 9.8 g/dL — ABNORMAL LOW (ref 12.0–15.0)
Immature Granulocytes: 1 %
Lymphocytes Relative: 6 %
Lymphs Abs: 1.1 10*3/uL (ref 0.7–4.0)
MCH: 29.5 pg (ref 26.0–34.0)
MCHC: 33 g/dL (ref 30.0–36.0)
MCV: 89.5 fL (ref 80.0–100.0)
Monocytes Absolute: 1 10*3/uL (ref 0.1–1.0)
Monocytes Relative: 5 %
Neutro Abs: 17 10*3/uL — ABNORMAL HIGH (ref 1.7–7.7)
Neutrophils Relative %: 87 %
Platelets: 415 10*3/uL — ABNORMAL HIGH (ref 150–400)
RBC: 3.32 MIL/uL — ABNORMAL LOW (ref 3.87–5.11)
RDW: 13.3 % (ref 11.5–15.5)
WBC: 19.4 10*3/uL — ABNORMAL HIGH (ref 4.0–10.5)
nRBC: 0 % (ref 0.0–0.2)

## 2020-01-10 LAB — IRON AND TIBC
Iron: 8 ug/dL — ABNORMAL LOW (ref 28–170)
Saturation Ratios: 3 % — ABNORMAL LOW (ref 10.4–31.8)
TIBC: 312 ug/dL (ref 250–450)
UIBC: 304 ug/dL

## 2020-01-10 LAB — I-STAT BETA HCG BLOOD, ED (NOT ORDERABLE): I-stat hCG, quantitative: <5 m[IU]/mL (ref <5)

## 2020-01-10 LAB — LACTIC ACID, PLASMA
Lactic Acid, Venous: 1.8 mmol/L (ref 0.5–1.9)
Lactic Acid, Venous: 1 mmol/L (ref 0.5–1.9)

## 2020-01-10 LAB — SARS CORONAVIRUS 2 BY RT PCR (HOSPITAL ORDER, PERFORMED IN ~~LOC~~ HOSPITAL LAB): SARS Coronavirus 2: NEGATIVE

## 2020-01-10 LAB — PROTIME-INR
INR: 1.1 (ref 0.8–1.2)
Prothrombin Time: 14.1 seconds (ref 11.4–15.2)

## 2020-01-10 LAB — FERRITIN: Ferritin: 124 ng/mL (ref 11–307)

## 2020-01-10 MED ORDER — HYDRALAZINE HCL 20 MG/ML IJ SOLN: 10.0000 mg | Freq: Four times a day (QID) | INTRAMUSCULAR | Status: DC | PRN

## 2020-01-10 MED ORDER — HYDROCODONE-ACETAMINOPHEN 5-325 MG PO TABS
1.0000 | ORAL_TABLET | Freq: Once | ORAL | Status: AC
  Administered 2020-01-10: 1 via ORAL
  Filled 2020-01-10: qty 1

## 2020-01-10 MED ORDER — ACETAMINOPHEN 650 MG RE SUPP: 650.0000 mg | Freq: Four times a day (QID) | RECTAL | Status: DC | PRN

## 2020-01-10 MED ORDER — ONDANSETRON HCL 4 MG PO TABS: 4.0000 mg | ORAL_TABLET | Freq: Four times a day (QID) | ORAL | Status: DC | PRN

## 2020-01-10 MED ORDER — IOHEXOL 300 MG/ML  SOLN
100.0000 mL | Freq: Once | INTRAMUSCULAR | Status: AC | PRN
  Administered 2020-01-10: 100 mL via INTRAVENOUS

## 2020-01-10 MED ORDER — SODIUM CHLORIDE (PF) 0.9 % IJ SOLN
INTRAMUSCULAR | Status: AC
  Filled 2020-01-10: qty 50

## 2020-01-10 MED ORDER — ACETAMINOPHEN 325 MG PO TABS
650.0000 mg | ORAL_TABLET | Freq: Once | ORAL | Status: AC
  Administered 2020-01-10: 650 mg via ORAL
  Filled 2020-01-10: qty 2

## 2020-01-10 MED ORDER — ACETAMINOPHEN 325 MG PO TABS
650.0000 mg | ORAL_TABLET | Freq: Four times a day (QID) | ORAL | Status: DC | PRN
  Administered 2020-01-10 – 2020-01-13 (×8): 650 mg via ORAL
  Filled 2020-01-10 (×8): qty 2

## 2020-01-10 MED ORDER — MORPHINE SULFATE (PF) 2 MG/ML IV SOLN
2.0000 mg | INTRAVENOUS | Status: DC | PRN
  Administered 2020-01-10 – 2020-01-12 (×3): 2 mg via INTRAVENOUS
  Filled 2020-01-10 (×4): qty 1

## 2020-01-10 MED ORDER — SODIUM CHLORIDE 0.9 % IV BOLUS
1000.0000 mL | Freq: Once | INTRAVENOUS | Status: AC
  Administered 2020-01-10: 1000 mL via INTRAVENOUS

## 2020-01-10 MED ORDER — IBUPROFEN 200 MG PO TABS
600.0000 mg | ORAL_TABLET | Freq: Four times a day (QID) | ORAL | Status: DC | PRN
  Administered 2020-01-10 – 2020-01-12 (×4): 600 mg via ORAL
  Filled 2020-01-10 (×4): qty 3

## 2020-01-10 MED ORDER — ONDANSETRON HCL 4 MG/2ML IJ SOLN: 4.0000 mg | Freq: Four times a day (QID) | INTRAMUSCULAR | Status: DC | PRN

## 2020-01-10 MED ORDER — VANCOMYCIN HCL IN DEXTROSE 1-5 GM/200ML-% IV SOLN
1000.0000 mg | Freq: Three times a day (TID) | INTRAVENOUS | Status: DC
  Administered 2020-01-10 – 2020-01-13 (×8): 1000 mg via INTRAVENOUS
  Filled 2020-01-10 (×8): qty 200

## 2020-01-10 MED ORDER — SODIUM CHLORIDE 0.9 % IV SOLN
2.0000 g | Freq: Three times a day (TID) | INTRAVENOUS | Status: DC
  Administered 2020-01-10 – 2020-01-12 (×6): 2 g via INTRAVENOUS
  Filled 2020-01-10 (×7): qty 2

## 2020-01-10 MED ORDER — VANCOMYCIN HCL IN DEXTROSE 1-5 GM/200ML-% IV SOLN
1000.0000 mg | Freq: Once | INTRAVENOUS | Status: AC
  Administered 2020-01-10: 1000 mg via INTRAVENOUS
  Filled 2020-01-10: qty 200

## 2020-01-10 MED ORDER — ENOXAPARIN SODIUM 40 MG/0.4ML ~~LOC~~ SOLN
40.0000 mg | SUBCUTANEOUS | Status: DC
  Administered 2020-01-10 – 2020-01-11 (×2): 40 mg via SUBCUTANEOUS
  Filled 2020-01-10 (×2): qty 0.4

## 2020-01-10 MED ORDER — LACTATED RINGERS IV SOLN: INTRAVENOUS | Status: DC

## 2020-01-10 MED ORDER — OXYCODONE HCL 5 MG PO TABS
5.0000 mg | ORAL_TABLET | ORAL | Status: DC | PRN
  Administered 2020-01-12 (×2): 5 mg via ORAL
  Filled 2020-01-10 (×2): qty 1

## 2020-01-10 NOTE — ED Provider Notes (Signed)
Dr. Estell Harpin originally discussed situation with Dr. Hester Mates.  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  I contacted Phylliss Blakes from general surgery she is recommending medicine admission and they will consult and then the ovary discussed the situation with plastic surgery tomorrow.  They are okay with the vancomycin.  CT scan shows no drainable fluid collection at this time.  Patient is currently tachycardic with a fever Marked leukocytosis.  But lactic acid was 1.  And Covid testing was negative  We will discuss with hospitalist.   Vanetta Mulders, MD 01/10/20 1725

## 2020-01-10 NOTE — Progress Notes (Signed)
Pharmacy Antibiotic Note  Nicole Christian is a 39 y.o. female admitted on 01/10/2020 with wound infection.  Pharmacy has been consulted for vanc/cefepime dosing.  Plan: Vancomycin 1g IV q8 - goal trough 15-20 mcg/mL Cefepime 2g IV q8  Height: 5\' 8"  (172.7 cm) Weight: 96.2 kg (212 lb) IBW/kg (Calculated) : 63.9  Temp (24hrs), Avg:99.8 F (37.7 C), Min:98.4 F (36.9 C), Max:100.6 F (38.1 C)  Recent Labs  Lab 01/10/20 1217 01/10/20 1218 01/10/20 1408  WBC 19.4*  --   --   CREATININE 0.68  --   --   LATICACIDVEN  --  1.8 1.0    Estimated Creatinine Clearance: 116.7 mL/min (by C-G formula based on SCr of 0.68 mg/dL).    No Known Allergies    Thank you for allowing pharmacy to be a part of this patient's care.  03/11/20 01/10/2020 5:54 PM

## 2020-01-10 NOTE — H&P (Signed)
History and Physical    DOA: 01/10/2020  PCP: Arvilla MarketWallace, Catherine Lauren, DO  Patient coming from: Home  Chief Complaint: Fever/chills  HPI: Nicole Christian is a 39 y.o. female with no significant past medical history (does not take any chronic medications) who recently underwent tummy tuck/abdominoplasty in [place] presents with postsurgical complications. Patient states she underwent abdominoplasty 1 week ago in same-day surgery unit in [place] followed by 4-day stay in the recovery house with nursing care.  She received oral antibiotics while there and was discharged with bilateral abdominal drains in place as well as a prescription for Keflex course x7 days.  Patient states she was only taking twice a day instead of three times a day as she tends to get yeast infection with antibiotics.  Patient developed fever with chills yesterday and also noted an area of tenderness/redness/induration along right lower abdomen/groin around the right JP drain.  She started feeling dizzy today and presented to the ED. ED course: T-max 100.68F, heart rate 1 10-1 30, respiratory rate 16-24, BP 94/67-133/77, O2 sat 96 to 100% on room air.  Labs show WBC 19.4, hemoglobin 9.8, HCT 29.7, platelet 415, sodium 131, potassium 3.7, chloride 101, bicarb 19, BUN 6, creatinine 0.68, glucose 183 and INR 1.1.  Lactate 1.8->1.0.  Patient received one dose of IV vancomycin in the ED.  EDP discussed with general surgery/plastic surgery Dr. Roosevelt Locksillingham--GS recommended admit to medicine and they will consult.   Review of Systems: As per HPI otherwise 10 point review of systems negative.    Past Medical History:  Diagnosis Date  . Abnormal Pap smear    repeat was fine  . Bell's palsy   . Ovarian cyst   . Sciatica   . Sciatica     Past Surgical History:  Procedure Laterality Date  . LAPAROSCOPIC TUBAL LIGATION  06/28/2012   Procedure: LAPAROSCOPIC TUBAL LIGATION;  Surgeon: Delbert Harnessaniel H Moore, MD;  Location: [facility];   Service: Gynecology;  Laterality: Bilateral;  . NO PAST SURGERIES    . VAGINAL DELIVERY     X2  . WISDOM TOOTH EXTRACTION      Social history:  reports that she has never smoked. She has never used smokeless tobacco. She reports current alcohol use of about 1.0 standard drink of alcohol per week. She reports that she does not use drugs.   No Known Allergies  Family History  Problem Relation Age of Onset  . Heart disease Maternal Grandmother        died from heart attack  . Heart attack Maternal Grandmother   . Hypertension Mother   . Diabetes Mother   . Hypertension Maternal Aunt   . Cancer Maternal Grandfather        lymph  . Other Neg Hx       Prior to Admission medications   Medication Sig Start Date End Date Taking? Authorizing Provider  acetaminophen (TYLENOL) 500 MG tablet Take 500 mg by mouth every 6 (six) hours as needed for moderate pain.   Yes [provider]  cephALEXin (KEFLEX) 500 MG capsule Take 500 mg by mouth 3 (three) times daily.   Yes [provider]  ibuprofen (ADVIL) 200 MG tablet Take 200 mg by mouth every 6 (six) hours as needed for moderate pain.   Yes [provider]  metroNIDAZOLE (FLAGYL) 500 MG tablet Take 1 tablet (500 mg total) by mouth 2 (two) times daily. Patient not taking: Reported on 01/10/2020 10/25/19   Arvilla MarketWallace, Catherine Lauren, DO  Physical Exam: Vitals:   01/10/20 1647 01/10/20 1700 01/10/20 1715 01/10/20 1717  BP:  108/76 107/68 107/68  Pulse:   (!) 120 (!) 120  Resp:  (!) 24 (!) 22 (!) 24  Temp:      TempSrc:      SpO2: 100% 100% 96% 97%  Weight:      Height:        Constitutional: NAD, calm, comfortable Eyes: PERRL, lids and conjunctivae normal ENMT: Mucous membranes are moist. Posterior pharynx clear of any exudate or lesions.Normal dentition.  Neck: normal, supple, no masses, no thyromegaly Respiratory: clear to auscultation bilaterally, no wheezing, no crackles. Normal respiratory effort. No  accessory muscle use.  Cardiovascular: Regular rate and rhythm, no murmurs / rubs / gallops. No extremity edema. 2+ pedal pulses. No carotid bruits.  Abdomen: Surgical smiling incision in lower abdomen (hip to hip) with absorbable sutures and Steri-Strips in place.  Bilateral drains with right JP drain showing purulent drainage and left JP drain showing sanguinous drainage as below.  Also area of redness and induration (marked as shown below) along right lower abdomen/inguinal area.  No fluctuant swelling. Musculoskeletal: no clubbing / cyanosis. No joint deformity upper and lower extremities. Good ROM, no contractures. Normal muscle tone.  Neurologic: CN 2-12 grossly intact. Sensation intact, DTR normal. Strength 5/5 in all 4.  Psychiatric: Normal judgment and insight. Alert and oriented x 3. Normal mood.  SKIN/catheters: Area of cellulitis/induration as shown below with bilateral JP drains       Labs on Admission: I have personally reviewed following labs and imaging studies  CBC: Recent Labs  Lab 01/10/20 1217  WBC 19.4*  NEUTROABS 17.0*  HGB 9.8*  HCT 29.7*  MCV 89.5  PLT 415*   Basic Metabolic Panel: Recent Labs  Lab 01/10/20 1217  NA 131*  K 3.7  CL 101  CO2 19*  GLUCOSE 183*  BUN 6  CREATININE 0.68  CALCIUM 8.9   GFR: Estimated Creatinine Clearance: 116.7 mL/min (by C-G formula based on SCr of 0.68 mg/dL). Recent Labs  Lab 01/10/20 1217 01/10/20 1218 01/10/20 1408  WBC 19.4*  --   --   LATICACIDVEN  --  1.8 1.0   Liver Function Tests: Recent Labs  Lab 01/10/20 1217  AST 19  ALT 37  ALKPHOS 74  BILITOT 1.5*  PROT 7.4  ALBUMIN 3.3*   No results for input(s): LIPASE, AMYLASE in the last 168 hours. No results for input(s): AMMONIA in the last 168 hours. Coagulation Profile: Recent Labs  Lab 01/10/20 1217  INR 1.1   Cardiac Enzymes: No results for input(s): CKTOTAL, CKMB, CKMBINDEX, TROPONINI in the last 168 hours. BNP (last 3 results) No  results for input(s): PROBNP in the last 8760 hours. HbA1C: No results for input(s): HGBA1C in the last 72 hours. CBG: No results for input(s): GLUCAP in the last 168 hours. Lipid Profile: No results for input(s): CHOL, HDL, LDLCALC, TRIG, CHOLHDL, LDLDIRECT in the last 72 hours. Thyroid Function Tests: No results for input(s): TSH, T4TOTAL, FREET4, T3FREE, THYROIDAB in the last 72 hours. Anemia Panel: No results for input(s): VITAMINB12, FOLATE, FERRITIN, TIBC, IRON, RETICCTPCT in the last 72 hours. Urine analysis:    Component Value Date/Time   COLORURINE YELLOW 01/10/2020 1400   APPEARANCEUR HAZY (A) 01/10/2020 1400   LABSPEC 1.017 01/10/2020 1400   PHURINE 5.0 01/10/2020 1400   GLUCOSEU NEGATIVE 01/10/2020 1400   HGBUR NEGATIVE 01/10/2020 1400   BILIRUBINUR NEGATIVE 01/10/2020 1400   BILIRUBINUR negative  09/07/2018 1431   KETONESUR NEGATIVE 01/10/2020 1400   PROTEINUR 30 (A) 01/10/2020 1400   UROBILINOGEN 0.2 09/07/2018 1431   UROBILINOGEN 0.2 04/01/2012 1623   NITRITE NEGATIVE 01/10/2020 1400   LEUKOCYTESUR NEGATIVE 01/10/2020 1400    Radiological Exams on Admission: Personally reviewed  DG Chest 2 View  Result Date: 01/10/2020 CLINICAL DATA:  Fever EXAM: CHEST - 2 VIEW COMPARISON:  August 18, 2004. FINDINGS: The cardiomediastinal silhouette is normal in contour. Trace fluid in the RIGHT minor fissure. No pleural effusion. No pneumothorax. No acute pleuroparenchymal abnormality. Visualized abdomen is unremarkable. No acute osseous abnormality noted. IMPRESSION: No acute cardiopulmonary abnormality. Electronically Signed   By: Meda Klinefelter MD   On: 01/10/2020 12:48   CT ABDOMEN PELVIS W CONTRAST  Result Date: 01/10/2020 CLINICAL DATA:  Recent tummy tuck and fever EXAM: CT ABDOMEN AND PELVIS WITH CONTRAST TECHNIQUE: Multidetector CT imaging of the abdomen and pelvis was performed using the standard protocol following bolus administration of intravenous contrast. CONTRAST:   OMNIPAQUE IOHEXOL 300 MG/ML  SOLN COMPARISON:  None. FINDINGS: Lower chest: Bibasilar atelectasis. Hepatobiliary: Focal fatty deposition adjacent to the falciform ligament. Cholelithiasis. No intrahepatic or extrahepatic biliary ductal dilation. Pancreas: Unremarkable. No pancreatic ductal dilatation or surrounding inflammatory changes. Spleen: Normal in size without focal abnormality. Adrenals/Urinary Tract: Adrenal glands are unremarkable. Kidneys are normal, without renal calculi, focal lesion, or hydronephrosis. Bladder is unremarkable. Stomach/Bowel: Stomach is within normal limits. No evidence of appendicitis. No evidence of bowel wall thickening, distention, or inflammatory changes. Vascular/Lymphatic: No significant vascular findings are present. No enlarged abdominal or pelvic lymph nodes. Reproductive: 2 cm cystic lesion of the RIGHT adnexa likely a dominant follicle. Fibroid uterus. Other: Extensive fat stranding of the anterior and posterior soft tissues consistent with history of recent abdominoplasty. There are 2 surgical drains in the abdomen. No focal drainable fluid collection within the anterior abdominal wall. There is trace fluid more focally confluent adjacent to the umbilicus. There is trace subcutaneous air along the tracks of the surgical drains. Musculoskeletal: No acute or significant osseous findings. IMPRESSION: 1. Extensive fat stranding of the anterior and posterior superficial soft tissues consistent with history of recent abdominoplasty. No focal drainable fluid collection within the anterior abdominal wall. No intra-abdominal extension. 2. Cholelithiasis without evidence of cholecystitis. Electronically Signed   By: Meda Klinefelter MD   On: 01/10/2020 15:19         Assessment and Plan:   Active Problems:   Wound infection    1. Sepsis due to surgical wound infection: Patient febrile, tachycardic, tachypneic with borderline hypotension (94/67) on presentation  with leukocytosis, thrombocytosis on labs meeting sepsis criteria.  Lactate normal.  Source of infection appears to be abdominal wound infection. Patient recently has been taking Keflex since discharge from outside facility.  Will admit with IV cefepime/vancomycin and follow-up blood culture/wound culture results.  IV fluids.  Normotensive now  2.  Nonhealing postsurgical abdominal wound: Patient had tummy tuck/abdominoplasty done in [place] and apparently (strangely) discharged with abdominal drain in place to follow-up with PCP here in [place].  Patient has had abnormal drainage with pain especially on the right side for few days now with chills leading to problem #1. ED physician discussed with plastic surgery/general surgery-for now general surgery will consult and anticipate them touching base with plastic surgery to come up with a plan in a.m. no drainable abscess noted on CT.  3.  Mild hyperbilirubinemia: Likely related to cholestasis in the setting of acute infection.  Monitor for now  4.  Acute normocytic anemia: Patient's baseline hemoglobin appears to be around 12-13 (last level checked in July).  Labs today show drop in hemoglobin to 9.8.  Could be secondary to blood loss from recent surgery as well as slow oozing into JP drains.  Also noted to have reactive thrombocytosis.  Monitor for now and transfuse as needed.  5.  Mild hyponatremia/non-anion gap acidosis: Likely secondary to volume depletion.  Will give IV hydration and repeat labs in AM.    DVT prophylaxis: SCD given bloody drainage in JP drains and anemia.  COVID screen: Negative  Code Status: Full code     Patient/Family Communication: Discussed with patient and all questions answered to satisfaction.  Consults called: General surgery/plastic surgery contacted by EDP Admission status :I certify that at the point of admission it is my clinical judgment that the patient will require inpatient hospital care spanning beyond 2 midnights  from the point of admission due to high intensity of service and high frequency of surveillance required.Inpatient status is judged to be reasonable and necessary in order to provide the required intensity of service to ensure the patient's safety. The patient's presenting symptoms, physical exam findings, and initial radiographic and laboratory data in the context of their chronic comorbidities is felt to place them at high risk for further clinical deterioration. The following factors support the patient status of inpatient : Wound infection with sepsis requiring IV antibiotics.     Alessandra Bevels MD [facility] Pager in [place]  If 7PM-7AM, please contact night-coverage [email or web address]   01/10/2020, 5:53 PM

## 2020-01-10 NOTE — ED Provider Notes (Signed)
[facility]-EMERGENCY DEPT Provider Note   CSN: [id number] Arrival date & time: 01/10/20  1151     History Chief Complaint  Patient presents with  . Post-op Problem  . Fever    Nicole Christian is a 39 y.o. female.  Patient states that she had a tummy tuck 1 week ago.  She has 2 drains in place.  She started with a fever yesterday and redness to the right side of her abdomen and tenderness.  The surgery was done in [place].  The history is provided by the patient. No language interpreter was used.  Fever Temp source:  Oral Severity:  Moderate Onset quality:  Sudden Timing:  Intermittent Progression:  Waxing and waning Chronicity:  New Relieved by:  Nothing Worsened by:  Nothing Ineffective treatments:  None tried Associated symptoms: no chest pain, no congestion, no cough, no diarrhea, no headaches and no rash   Risk factors: no contaminated food        Past Medical History:  Diagnosis Date  . Abnormal Pap smear    repeat was fine  . Bell's palsy   . Ovarian cyst   . Sciatica   . Sciatica     Patient Active Problem List   Diagnosis Date Noted  . Mixed hyperlipidemia 10/25/2019    Past Surgical History:  Procedure Laterality Date  . LAPAROSCOPIC TUBAL LIGATION  06/28/2012   Procedure: LAPAROSCOPIC TUBAL LIGATION;  Surgeon: Delbert Harnessaniel H Moore, MD;  Location: [facility];  Service: Gynecology;  Laterality: Bilateral;  . NO PAST SURGERIES    . VAGINAL DELIVERY     X2  . WISDOM TOOTH EXTRACTION       OB History    Gravida  3   Para  3   Term  3   Preterm      AB      Living  3     SAB      TAB      Ectopic      Multiple      Live Births  3           Family History  Problem Relation Age of Onset  . Heart disease Maternal Grandmother        died from heart attack  . Heart attack Maternal Grandmother   . Hypertension Mother   . Diabetes Mother   . Hypertension Maternal Aunt   . Cancer Maternal Grandfather         lymph  . Other Neg Hx     Social History   Tobacco Use  . Smoking status: Never Smoker  . Smokeless tobacco: Never Used  Vaping Use  . Vaping Use: Never used  Substance Use Topics  . Alcohol use: Yes    Alcohol/week: 1.0 standard drink    Types: 1 Cans of beer per week    Comment: occasional  . Drug use: No    Home Medications Prior to Admission medications   Medication Sig Start Date End Date Taking? Authorizing Provider  acetaminophen (TYLENOL) 500 MG tablet Take 500 mg by mouth every 6 (six) hours as needed for moderate pain.   Yes [provider]  cephALEXin (KEFLEX) 500 MG capsule Take 500 mg by mouth 3 (three) times daily.   Yes [provider]  ibuprofen (ADVIL) 200 MG tablet Take 200 mg by mouth every 6 (six) hours as needed for moderate pain.   Yes [provider]  metroNIDAZOLE (FLAGYL) 500 MG tablet Take  1 tablet (500 mg total) by mouth 2 (two) times daily. Patient not taking: Reported on 01/10/2020 10/25/19   Arvilla Market, DO    Allergies    Patient has no known allergies.  Review of Systems   Review of Systems  Constitutional: Positive for fever. Negative for appetite change and fatigue.  HENT: Negative for congestion, ear discharge and sinus pressure.   Eyes: Negative for discharge.  Respiratory: Negative for cough.   Cardiovascular: Negative for chest pain.  Gastrointestinal: Positive for abdominal pain. Negative for diarrhea.  Genitourinary: Negative for frequency and hematuria.  Musculoskeletal: Negative for back pain.  Skin: Negative for rash.  Neurological: Negative for seizures and headaches.  Psychiatric/Behavioral: Negative for hallucinations.    Physical Exam Updated Vital Signs BP 94/67 (BP Location: Right Arm)   Pulse (!) 123   Temp 98.4 F (36.9 C) (Oral)   Resp 16   LMP 12/26/2019 (Approximate)   SpO2 98%   Physical Exam Vitals and nursing note reviewed.  Constitutional:      Appearance: She  is well-developed.  HENT:     Head: Normocephalic.     Nose: Nose normal.  Eyes:     General: No scleral icterus.    Conjunctiva/sclera: Conjunctivae normal.  Neck:     Thyroid: No thyromegaly.  Cardiovascular:     Rate and Rhythm: Normal rate and regular rhythm.     Heart sounds: No murmur heard.  No friction rub. No gallop.   Pulmonary:     Breath sounds: No stridor. No wheezing or rales.  Chest:     Chest wall: No tenderness.  Abdominal:     General: There is no distension.     Tenderness: There is abdominal tenderness. There is no rebound.  Musculoskeletal:        General: Normal range of motion.     Cervical back: Neck supple.      Lymphadenopathy:     Cervical: No cervical adenopathy.  Skin:    Findings: No erythema or rash.  Neurological:     Mental Status: She is alert and oriented to person, place, and time.     Motor: No abnormal muscle tone.     Coordination: Coordination normal.  Psychiatric:        Behavior: Behavior normal.     ED Results / Procedures / Treatments   Labs (all labs ordered are listed, but only abnormal results are displayed) Labs Reviewed  COMPREHENSIVE METABOLIC PANEL - Abnormal; Notable for the following components:      Result Value   Sodium 131 (*)    CO2 19 (*)    Glucose, Bld 183 (*)    Albumin 3.3 (*)    Total Bilirubin 1.5 (*)    All other components within normal limits  CBC WITH DIFFERENTIAL/PLATELET - Abnormal; Notable for the following components:   WBC 19.4 (*)    RBC 3.32 (*)    Hemoglobin 9.8 (*)    HCT 29.7 (*)    Platelets 415 (*)    Neutro Abs 17.0 (*)    Abs Immature Granulocytes 0.28 (*)    All other components within normal limits  URINALYSIS, ROUTINE W REFLEX MICROSCOPIC - Abnormal; Notable for the following components:   APPearance HAZY (*)    Protein, ur 30 (*)    Bacteria, UA RARE (*)    All other components within normal limits  SARS CORONAVIRUS 2 BY RT PCR (HOSPITAL ORDER, PERFORMED IN [facility])  CULTURE, BLOOD (ROUTINE X 2)  CULTURE, BLOOD (ROUTINE X 2)  LACTIC ACID, PLASMA  LACTIC ACID, PLASMA  PROTIME-INR  I-STAT BETA HCG BLOOD, ED ([facility], [facility], [facility] ONLY)  I-STAT BETA HCG BLOOD, ED (NOT ORDERABLE)    EKG None  Radiology DG Chest 2 View  Result Date: 01/10/2020 CLINICAL DATA:  Fever EXAM: CHEST - 2 VIEW COMPARISON:  August 18, 2004. FINDINGS: The cardiomediastinal silhouette is normal in contour. Trace fluid in the RIGHT minor fissure. No pleural effusion. No pneumothorax. No acute pleuroparenchymal abnormality. Visualized abdomen is unremarkable. No acute osseous abnormality noted. IMPRESSION: No acute cardiopulmonary abnormality. Electronically Signed   By: Meda Klinefelter MD   On: 01/10/2020 12:48   CT ABDOMEN PELVIS W CONTRAST  Result Date: 01/10/2020 CLINICAL DATA:  Recent tummy tuck and fever EXAM: CT ABDOMEN AND PELVIS WITH CONTRAST TECHNIQUE: Multidetector CT imaging of the abdomen and pelvis was performed using the standard protocol following bolus administration of intravenous contrast. CONTRAST:  OMNIPAQUE IOHEXOL 300 MG/ML  SOLN COMPARISON:  None. FINDINGS: Lower chest: Bibasilar atelectasis. Hepatobiliary: Focal fatty deposition adjacent to the falciform ligament. Cholelithiasis. No intrahepatic or extrahepatic biliary ductal dilation. Pancreas: Unremarkable. No pancreatic ductal dilatation or surrounding inflammatory changes. Spleen: Normal in size without focal abnormality. Adrenals/Urinary Tract: Adrenal glands are unremarkable. Kidneys are normal, without renal calculi, focal lesion, or hydronephrosis. Bladder is unremarkable. Stomach/Bowel: Stomach is within normal limits. No evidence of appendicitis. No evidence of bowel wall thickening, distention, or inflammatory changes. Vascular/Lymphatic: No significant vascular findings are present. No enlarged abdominal or pelvic lymph nodes. Reproductive: 2 cm cystic lesion of the RIGHT adnexa likely a  dominant follicle. Fibroid uterus. Other: Extensive fat stranding of the anterior and posterior soft tissues consistent with history of recent abdominoplasty. There are 2 surgical drains in the abdomen. No focal drainable fluid collection within the anterior abdominal wall. There is trace fluid more focally confluent adjacent to the umbilicus. There is trace subcutaneous air along the tracks of the surgical drains. Musculoskeletal: No acute or significant osseous findings. IMPRESSION: 1. Extensive fat stranding of the anterior and posterior superficial soft tissues consistent with history of recent abdominoplasty. No focal drainable fluid collection within the anterior abdominal wall. No intra-abdominal extension. 2. Cholelithiasis without evidence of cholecystitis. Electronically Signed   By: Meda Klinefelter MD   On: 01/10/2020 15:19    Procedures Procedures (including critical care time)  Medications Ordered in ED Medications  sodium chloride 0.9 % bolus 1,000 mL (has no administration in time range)  vancomycin (VANCOCIN) IVPB 1000 mg/200 mL premix ( Intravenous Stopped 01/10/20 1517)  sodium chloride (PF) 0.9 % injection (  Given by Other 01/10/20 1520)  iohexol (OMNIPAQUE) 300 MG/ML solution 100 mL (100 mLs Intravenous Contrast Given 01/10/20 1427)    ED Course  I have reviewed the triage vital signs and the nursing notes.  Pertinent labs & imaging results that were available during my care of the patient were reviewed by me and considered in my medical decision making (see chart for details).    MDM Rules/Calculators/A&P                          CT scan unremarkable.  Patient does have an elevated white count.  Suspect patient has a cellulitis on the right side of her incision.  Will consult surgery for decision of inpatient or outpatient treatment Final Clinical Impression(s) / ED Diagnoses Final diagnoses:  None  Rx / DC Orders ED Discharge Orders    None       Bethann Berkshire, MD 01/10/20 [id number]

## 2020-01-10 NOTE — ED Triage Notes (Signed)
Pt reports had tummy tuck in [place] last week. Reports yesterday started having chills, hot flashes, fever. Has hard spot on right side of her abd and not draining as much on side as she still has drains in place.

## 2020-01-10 NOTE — Consult Note (Addendum)
Surgical Evaluation Requesting provider: Dr. Deretha Emory  Chief Complaint: fever  HPI: Delightful 39 year old woman who underwent abdominoplasty in [place] on 01/03/2020.  She was discharged with 2 surgical drains and instructed to remove these today.  She had been doing well until yesterday when she began to feel increasing pain in noticed redness to the right side of her abdominal wall, associated with fever and generally not feeling well.  She has been taking excellent care of the drains, her incisions, and has been following all of the instructions provided to her.  She noticed today that the right-sided JP drain looked more opaque, and along with her other symptoms this prompted her to present to the emergency room.  No Known Allergies  Past Medical History:  Diagnosis Date  . Abnormal Pap smear    repeat was fine  . Bell's palsy   . Ovarian cyst   . Sciatica   . Sciatica     Past Surgical History:  Procedure Laterality Date  . LAPAROSCOPIC TUBAL LIGATION  06/28/2012   Procedure: LAPAROSCOPIC TUBAL LIGATION;  Surgeon: Delbert Harness, MD;  Location: [facility];  Service: Gynecology;  Laterality: Bilateral;  . NO PAST SURGERIES    . VAGINAL DELIVERY     X2  . WISDOM TOOTH EXTRACTION      Family History  Problem Relation Age of Onset  . Heart disease Maternal Grandmother        died from heart attack  . Heart attack Maternal Grandmother   . Hypertension Mother   . Diabetes Mother   . Hypertension Maternal Aunt   . Cancer Maternal Grandfather        lymph  . Other Neg Hx     Social History   Socioeconomic History  . Marital status: Single    Spouse name: Not on file  . Number of children: Not on file  . Years of education: Not on file  . Highest education level: Not on file  Occupational History  . Not on file  Tobacco Use  . Smoking status: Never Smoker  . Smokeless tobacco: Never Used  Vaping Use  . Vaping Use: Never used  Substance and Sexual Activity  . Alcohol  use: Yes    Alcohol/week: 1.0 standard drink    Types: 1 Cans of beer per week    Comment: occasional  . Drug use: No  . Sexual activity: Yes    Birth control/protection: None    Comment: tubal  Other Topics Concern  . Not on file  Social History Narrative  . Not on file   Social Determinants of Health   Financial Resource Strain:   . Difficulty of Paying Living Expenses:   Food Insecurity:   . Worried About Programme researcher, broadcasting/film/video in the Last Year:   . Barista in the Last Year:   Transportation Needs:   . Freight forwarder (Medical):   Marland Kitchen Lack of Transportation (Non-Medical):   Physical Activity:   . Days of Exercise per Week:   . Minutes of Exercise per Session:   Stress:   . Feeling of Stress :   Social Connections:   . Frequency of Communication with Friends and Family:   . Frequency of Social Gatherings with Friends and Family:   . Attends Religious Services:   . Active Member of Clubs or Organizations:   . Attends Banker Meetings:   Marland Kitchen Marital Status:     No current facility-administered medications on file  prior to encounter.   Current Outpatient Medications on File Prior to Encounter  Medication Sig Dispense Refill  . acetaminophen (TYLENOL) 500 MG tablet Take 500 mg by mouth every 6 (six) hours as needed for moderate pain.    . cephALEXin (KEFLEX) 500 MG capsule Take 500 mg by mouth 3 (three) times daily.    Marland Kitchen ibuprofen (ADVIL) 200 MG tablet Take 200 mg by mouth every 6 (six) hours as needed for moderate pain.    . metroNIDAZOLE (FLAGYL) 500 MG tablet Take 1 tablet (500 mg total) by mouth 2 (two) times daily. (Patient not taking: Reported on 01/10/2020) 14 tablet 0    Review of Systems: a complete, 10pt review of systems was completed with pertinent positives and negatives as documented in the HPI  Physical Exam: Vitals:   01/10/20 1715 01/10/20 1717  BP: 107/68 107/68  Pulse: (!) 120 (!) 120  Resp: (!) 22 (!) 24  Temp:    SpO2: 96%  97%   Gen: A&Ox3, no distress  Eyes: lids and conjunctivae normal, no icterus. Pupils equally round and reactive to light.  Neck: supple without mass or thyromegaly Chest: respiratory effort is normal. No crepitus or tenderness on palpation of the chest. Breath sounds equal.  Cardiovascular: Tachycardic to 120, regular, with palpable distal pulses, no pedal edema Gastrointestinal: soft, nondistended.  Neoumbilicus with black eschar but no surrounding erythema, induration or tenderness, no drainage.  Panniculectomy incision is intact, I did remove one of the Steri-Strips to look at the incision in the region of maximal erythema and it looks to be intact without purulent drainage.  There is erythema, edema and tenderness of the abdominal wall along the right lower abdomen extending onto the right hip without fluctuance.  No mass, hepatomegaly or splenomegaly. No hernia.  JP drain on the right side, which by CT extends along the superior portion of the subcutaneous space contains a mix of serous and opaque fluid, which does not quite look purulent.  Tubing stripped with minimal return.  JP drain on the left side which by CT extends along the lower portion of the subcutaneous space contained serosanguineous fluid with some clots in the tubing. Lymphatic: no lymphadenopathy in the neck or groin Muscoloskeletal: no clubbing or cyanosis of the fingers.  Strength is symmetrical throughout.  Range of motion of bilateral upper and lower extremities normal without pain, crepitation or contracture. Neuro: cranial nerves grossly intact.  Sensation intact to light touch diffusely. Psych: appropriate mood and affect, normal insight/judgment intact  Skin: warm and dry   CBC Latest Ref Rng & Units 01/10/2020 12/06/2019 10/24/2019  WBC 4.0 - 10.5 K/uL 19.4(H) 5.0 4.7  Hemoglobin 12.0 - 15.0 g/dL 1.5(V) 76.1 60.7  Hematocrit 36 - 46 % 29.7(L) 37.2 39.1  Platelets 150 - 400 K/uL 415(H) 293 315    CMP Latest Ref Rng &  Units 01/10/2020 12/06/2019 10/24/2019  Glucose 70 - 99 mg/dL 371(G) 97 626(R)  BUN 6 - 20 mg/dL 6 9 7   Creatinine 0.44 - 1.00 mg/dL 4.85 4.62  Sodium 135 - 145 mmol/L 131(L) 144 138  Potassium 3.5 - 5.1 mmol/L 3.7 4.4 4.3  Chloride 98 - 111 mmol/L 101 108(H) 107(H)  CO2 22 - 32 mmol/L 19(L) 23 18(L)  Calcium 8.9 - 10.3 mg/dL 8.9 9.2 9.3  Total Protein 6.5 - 8.1 g/dL 7.4 6.5 7.0  Total Bilirubin 0.3 - 1.2 mg/dL 7.03) 0.4 0.5  Alkaline Phos 38 - 126 U/L 74 46(L) 52  AST  15 - 41 U/L 19 14 19   ALT 0 - 44 U/L 37 20 24    Lab Results  Component Value Date   INR 1.1 01/10/2020   INR 1.0 12/06/2019   INR 1.0 09/07/2018    Imaging: DG Chest 2 View  Result Date: 01/10/2020 CLINICAL DATA:  Fever EXAM: CHEST - 2 VIEW COMPARISON:  August 18, 2004. FINDINGS: The cardiomediastinal silhouette is normal in contour. Trace fluid in the RIGHT minor fissure. No pleural effusion. No pneumothorax. No acute pleuroparenchymal abnormality. Visualized abdomen is unremarkable. No acute osseous abnormality noted. IMPRESSION: No acute cardiopulmonary abnormality. Electronically Signed   By: August 20, 2004 MD   On: 01/10/2020 12:48   CT ABDOMEN PELVIS W CONTRAST  Result Date: 01/10/2020 CLINICAL DATA:  Recent tummy tuck and fever EXAM: CT ABDOMEN AND PELVIS WITH CONTRAST TECHNIQUE: Multidetector CT imaging of the abdomen and pelvis was performed using the standard protocol following bolus administration of intravenous contrast. CONTRAST:  03/11/2020 OMNIPAQUE IOHEXOL 300 MG/ML  SOLN COMPARISON:  None. FINDINGS: Lower chest: Bibasilar atelectasis. Hepatobiliary: Focal fatty deposition adjacent to the falciform ligament. Cholelithiasis. No intrahepatic or extrahepatic biliary ductal dilation. Pancreas: Unremarkable. No pancreatic ductal dilatation or surrounding inflammatory changes. Spleen: Normal in size without focal abnormality. Adrenals/Urinary Tract: Adrenal glands are unremarkable. Kidneys are normal, without  renal calculi, focal lesion, or hydronephrosis. Bladder is unremarkable. Stomach/Bowel: Stomach is within normal limits. No evidence of appendicitis. No evidence of bowel wall thickening, distention, or inflammatory changes. Vascular/Lymphatic: No significant vascular findings are present. No enlarged abdominal or pelvic lymph nodes. Reproductive: 2 cm cystic lesion of the RIGHT adnexa likely a dominant follicle. Fibroid uterus. Other: Extensive fat stranding of the anterior and posterior soft tissues consistent with history of recent abdominoplasty. There are 2 surgical drains in the abdomen. No focal drainable fluid collection within the anterior abdominal wall. There is trace fluid more focally confluent adjacent to the umbilicus. There is trace subcutaneous air along the tracks of the surgical drains. Musculoskeletal: No acute or significant osseous findings. IMPRESSION: 1. Extensive fat stranding of the anterior and posterior superficial soft tissues consistent with history of recent abdominoplasty. No focal drainable fluid collection within the anterior abdominal wall. No intra-abdominal extension. 2. Cholelithiasis without evidence of cholecystitis. Electronically Signed   By: MD   On: 01/10/2020 15:19     A/P: Abdominal wall cellulitis, postop day 7 status post abdominoplasty at an outside facility.  I have reviewed the CT images personally.  In general it looks about as I would expect in the early postop setting. There is no drainable fluid collection and while the JP drain output is not particularly concerning, she certainly does have cellulitis of the abdominal wall and sepsis given leukocytosis, tachycardia and low-grade fever.  At this point no surgical intervention is indicated, however given her overall picture I do agree that the safest approach would be to admit for IV antibiotics and serial exams.  We will reassess in the morning. OK for diet from my standpoint. She will  ultimately benefit from outpatient follow-up with the plastic surgery team.     Patient Active Problem List   Diagnosis Date Noted  . Mixed hyperlipidemia 10/25/2019       10/27/2019, MD [facility]  See AMION to contact appropriate on-call provider

## 2020-01-10 NOTE — ED Notes (Signed)
ED TO INPATIENT HANDOFF REPORT  ED Nurse Name and Phone #: Sharene Skeans [phone]  S Name/Age/Gender Nicole Christian 39 y.o. female Room/Bed: WA14/WA14  Code Status   Code Status: Full Code  Home/SNF/Other Home Patient oriented to: self, place, time and situation Is this baseline? Yes   Triage Complete: Triage complete  Chief Complaint Wound infection [T14.8XXA, L08.9]  Triage Note Pt reports had tummy tuck in [place] last week. Reports yesterday started having chills, hot flashes, fever. Has hard spot on right side of her abd and not draining as much on side as she still has drains in place.     Allergies No Known Allergies  Level of Care/Admitting Diagnosis ED Disposition    ED Disposition Condition Comment   Admit  Hospital Area: [facility] [[id number]]  Level of Care: Med-Surg [16]  May admit patient to [facility] or [facility] if equivalent level of care is available:: Yes  Covid Evaluation: Confirmed COVID Negative  Diagnosis: Wound infection [[id number]]  Admitting Physician: Alessandra Bevels [[id number]]  Attending Physician: Alessandra Bevels [[id number]]  Estimated length of stay: past midnight tomorrow  Certification:: I certify this patient will need inpatient services for at least 2 midnights       B Medical/Surgery History Past Medical History:  Diagnosis Date  . Abnormal Pap smear    repeat was fine  . Bell's palsy   . Ovarian cyst   . Sciatica   . Sciatica    Past Surgical History:  Procedure Laterality Date  . LAPAROSCOPIC TUBAL LIGATION  06/28/2012   Procedure: LAPAROSCOPIC TUBAL LIGATION;  Surgeon: Delbert Harness, MD;  Location: [facility];  Service: Gynecology;  Laterality: Bilateral;  . NO PAST SURGERIES    . VAGINAL DELIVERY     X2  . WISDOM TOOTH EXTRACTION       A IV Location/Drains/Wounds Patient Lines/Drains/Airways Status    Active Line/Drains/Airways    Name Placement date Placement time Site Days   Peripheral  IV 01/10/20 Left Wrist 01/10/20  1401  Wrist  less than 1   Closed System Drain Right;Anterior Hip Bulb (JP) 01/03/20  --  Hip  7   Closed System Drain Left;Anterior Hip Bulb (JP) 01/03/20  --  Hip  7          Intake/Output Last 24 hours  Intake/Output Summary (Last 24 hours) at 01/10/2020 1950 Last data filed at 01/10/2020 1853 Gross per 24 hour  Intake 1294.04 ml  Output 50 ml  Net 1244.04 ml    Labs/Imaging Results for orders placed or performed during the hospital encounter of 01/10/20 (from the past 48 hour(s))  Comprehensive metabolic panel     Status: Abnormal   Collection Time: 01/10/20 12:17 PM  Result Value Ref Range   Sodium 131 (L) 135 - 145 mmol/L   Potassium 3.7 3.5 - 5.1 mmol/L   Chloride 101 98 - 111 mmol/L   CO2 19 (L) 22 - 32 mmol/L   Glucose, Bld 183 (H) 70 - 99 mg/dL    Comment: Glucose reference range applies only to samples taken after fasting for at least 8 hours.   BUN 6 6 - 20 mg/dL   Creatinine, Ser 9.03 0.44 - 1.00 mg/dL   Calcium 8.9 8.9 - 00.9 mg/dL   Total Protein 7.4 6.5 - 8.1 g/dL   Albumin 3.3 (L) 3.5 - 5.0 g/dL   AST 19 15 - 41 U/L   ALT 37 0 - 44 U/L   Alkaline  Phosphatase 74 38 - 126 U/L   Total Bilirubin 1.5 (H) 0.3 - 1.2 mg/dL   GFR calc non Af Amer >60 >60 mL/min   GFR calc Af Amer >60 >60 mL/min   Anion gap 11 5 - 15    Comment: Performed at [facility], [address] [place], [place] [address]  CBC with Differential     Status: Abnormal   Collection Time: 01/10/20 12:17 PM  Result Value Ref Range   WBC 19.4 (H) 4.0 - 10.5 K/uL   RBC 3.32 (L) 3.87 - 5.11 MIL/uL   Hemoglobin 9.8 (L) 12.0 - 15.0 g/dL   HCT 40.929.7 (L) 36 - 46 %   MCV 89.5 80.0 - 100.0 fL   MCH 29.5 26.0 - 34.0 pg   MCHC 33.0 30.0 - 36.0 g/dL   RDW 81.113.3 91.411.5 - 78.215.5 %   Platelets 415 (H) 150 - 400 K/uL   nRBC 0.0 0.0 - 0.2 %   Neutrophils Relative % 87 %   Neutro Abs 17.0 (H) 1.7 - 7.7 K/uL   Lymphocytes Relative 6 %   Lymphs Abs 1.1 0.7 -  4.0 K/uL   Monocytes Relative 5 %   Monocytes Absolute 1.0 0 - 1 K/uL   Eosinophils Relative 1 %   Eosinophils Absolute 0.1 0 - 0 K/uL   Basophils Relative 0 %   Basophils Absolute 0.0 0 - 0 K/uL   Immature Granulocytes 1 %   Abs Immature Granulocytes 0.28 (H) 0.00 - 0.07 K/uL    Comment: Performed at [facility], [address] [place], [place] [address]  Protime-INR     Status: None   Collection Time: 01/10/20 12:17 PM  Result Value Ref Range   Prothrombin Time 14.1 11.4 - 15.2 seconds   INR 1.1 0.8 - 1.2    Comment: (NOTE) INR goal varies based on device and disease states. Performed at [facility], [address] [place], [place] [address]   Lactic acid, plasma     Status: None   Collection Time: 01/10/20 12:18 PM  Result Value Ref Range   Lactic Acid, Venous 1.8 0.5 - 1.9 mmol/L    Comment: Performed at [facility], [address] [place], [place] [address]  I-Stat beta hCG blood, ED     Status: None   Collection Time: 01/10/20 12:24 PM  Result Value Ref Range   I-stat hCG, quantitative <5.0 <5 mIU/mL   Comment 3            Comment:   GEST. AGE      CONC.  (mIU/mL)   <=1 WEEK        5 - 50     2 WEEKS       50 - 500     3 WEEKS       100 - 10,000     4 WEEKS     1,000 - 30,000        FEMALE AND NON-PREGNANT FEMALE:     LESS THAN 5 mIU/mL   SARS Coronavirus 2 by RT PCR (hospital order, performed in [facility] hospital lab) Nasopharyngeal Nasopharyngeal Swab     Status: None   Collection Time: 01/10/20  1:36 PM   Specimen: Nasopharyngeal Swab  Result Value Ref Range   SARS Coronavirus 2 NEGATIVE NEGATIVE    Comment: (NOTE) SARS-CoV-2 target nucleic acids are NOT DETECTED.  The SARS-CoV-2 RNA is generally detectable in upper and  lower respiratory specimens during the acute phase of infection. The lowest concentration of SARS-CoV-2 viral copies this assay can detect is 250 copies / mL. A negative result  does not preclude SARS-CoV-2 infection and should not be used as the sole basis for treatment or other patient management decisions.  A negative result may occur with improper specimen collection / handling, submission of specimen other than nasopharyngeal swab, presence of viral mutation(s) within the areas targeted by this assay, and inadequate number of viral copies (<250 copies / mL). A negative result must be combined with clinical observations, patient history, and epidemiological information.  Fact Sheet for Patients:   BoilerBrush.com.cy  Fact Sheet for Healthcare Providers: https://pope.com/  This test is not yet approved or  cleared by the [place] FDA and has been authorized for detection and/or diagnosis of SARS-CoV-2 by FDA under an Emergency Use Authorization (EUA).  This EUA will remain in effect (meaning this test can be used) for the duration of the COVID-19 declaration under Section 564(b)(1) of the Act, 21 U.S.C. section 360bbb-3(b)(1), unless the authorization is terminated or revoked sooner.  Performed at [facility], [address], [place], [place] [address]   Urinalysis, Routine w reflex microscopic     Status: Abnormal   Collection Time: 01/10/20  2:00 PM  Result Value Ref Range   Color, Urine YELLOW YELLOW   APPearance HAZY (A) CLEAR   Specific Gravity, Urine 1.017 1.005 - 1.030   pH 5.0 5.0 - 8.0   Glucose, UA NEGATIVE NEGATIVE mg/dL   Hgb urine dipstick NEGATIVE NEGATIVE   Bilirubin Urine NEGATIVE NEGATIVE   Ketones, ur NEGATIVE NEGATIVE mg/dL   Protein, ur 30 (A) NEGATIVE mg/dL   Nitrite NEGATIVE NEGATIVE   Leukocytes,Ua NEGATIVE NEGATIVE   RBC / HPF 0-5 0 - 5 RBC/hpf   WBC, UA 0-5 0 - 5 WBC/hpf   Bacteria, UA RARE (A) NONE SEEN   Squamous Epithelial / LPF 6-10 0 - 5   Mucus PRESENT     Comment: Performed at [facility], [address],  [place], [place] [address]  Lactic acid, plasma     Status: None   Collection Time: 01/10/20  2:08 PM  Result Value Ref Range   Lactic Acid, Venous 1.0 0.5 - 1.9 mmol/L    Comment: Performed at [facility], [address], [place], [place] [address]   DG Chest 2 View  Result Date: 01/10/2020 CLINICAL DATA:  Fever EXAM: CHEST - 2 VIEW COMPARISON:  August 18, 2004. FINDINGS: The cardiomediastinal silhouette is normal in contour. Trace fluid in the RIGHT minor fissure. No pleural effusion. No pneumothorax. No acute pleuroparenchymal abnormality. Visualized abdomen is unremarkable. No acute osseous abnormality noted. IMPRESSION: No acute cardiopulmonary abnormality. Electronically Signed   By: Meda Klinefelter MD   On: 01/10/2020 12:48   CT ABDOMEN PELVIS W CONTRAST  Result Date: 01/10/2020 CLINICAL DATA:  Recent tummy tuck and fever EXAM: CT ABDOMEN AND PELVIS WITH CONTRAST TECHNIQUE: Multidetector CT imaging of the abdomen and pelvis was performed using the standard protocol following bolus administration of intravenous contrast. CONTRAST:  OMNIPAQUE IOHEXOL 300 MG/ML  SOLN COMPARISON:  None. FINDINGS: Lower chest: Bibasilar atelectasis. Hepatobiliary: Focal fatty deposition adjacent to the falciform ligament. Cholelithiasis. No intrahepatic or extrahepatic biliary ductal dilation. Pancreas: Unremarkable. No pancreatic ductal dilatation or surrounding inflammatory changes. Spleen: Normal in size without focal abnormality. Adrenals/Urinary Tract: Adrenal glands are unremarkable. Kidneys are normal, without renal calculi, focal  lesion, or hydronephrosis. Bladder is unremarkable. Stomach/Bowel: Stomach is within normal limits. No evidence of appendicitis. No evidence of bowel wall thickening, distention, or inflammatory changes. Vascular/Lymphatic: No significant vascular findings are present. No enlarged abdominal or pelvic lymph nodes. Reproductive: 2 cm cystic lesion of the RIGHT  adnexa likely a dominant follicle. Fibroid uterus. Other: Extensive fat stranding of the anterior and posterior soft tissues consistent with history of recent abdominoplasty. There are 2 surgical drains in the abdomen. No focal drainable fluid collection within the anterior abdominal wall. There is trace fluid more focally confluent adjacent to the umbilicus. There is trace subcutaneous air along the tracks of the surgical drains. Musculoskeletal: No acute or significant osseous findings. IMPRESSION: 1. Extensive fat stranding of the anterior and posterior superficial soft tissues consistent with history of recent abdominoplasty. No focal drainable fluid collection within the anterior abdominal wall. No intra-abdominal extension. 2. Cholelithiasis without evidence of cholecystitis. Electronically Signed   By: Meda Klinefelter MD   On: 01/10/2020 15:19    Pending Labs Unresulted Labs (From admission, onward) Comment          Start     Ordered   01/17/20 0500  Creatinine, serum  (enoxaparin (LOVENOX)    CrCl >/= 30 ml/min)  Weekly,   R     Comments: while on enoxaparin therapy    01/10/20 1752   01/11/20 0500  Basic metabolic panel  Tomorrow morning,   R        01/10/20 1752   01/11/20 0500  CBC  Tomorrow morning,   R        01/10/20 1752   01/10/20 1840  Iron and TIBC  Once,   STAT        01/10/20 1839   01/10/20 1840  Ferritin  Once,   STAT        01/10/20 1839   01/10/20 1828  Aerobic/Anaerobic Culture (surgical/deep wound)  Once,   STAT        01/10/20 1828   01/10/20 1208  Culture, blood (Routine x 2)  BLOOD CULTURE X 2,   STAT      01/10/20 1208          Vitals/Pain Today's Vitals   01/10/20 1717 01/10/20 1810 01/10/20 1823 01/10/20 1831  BP: 107/68 102/61  134/75  Pulse: (!) 120 (!) 115  (!) 118  Resp: (!) 24 16    Temp:   99.1 F (37.3 C)   TempSrc:   Oral   SpO2: 97% 99%  99%  Weight:      Height:      PainSc:        Isolation Precautions No active  isolations  Medications Medications  enoxaparin (LOVENOX) injection 40 mg (has no administration in time range)  lactated ringers infusion ( Intravenous New Bag/Given 01/10/20 1920)  acetaminophen (TYLENOL) tablet 650 mg (has no administration in time range)    Or  acetaminophen (TYLENOL) suppository 650 mg (has no administration in time range)  oxyCODONE (Oxy IR/ROXICODONE) immediate release tablet 5 mg (has no administration in time range)  morphine 2 MG/ML injection 2 mg (has no administration in time range)  ondansetron (ZOFRAN) tablet 4 mg (has no administration in time range)    Or  ondansetron (ZOFRAN) injection 4 mg (has no administration in time range)  hydrALAZINE (APRESOLINE) injection 10 mg (has no administration in time range)  vancomycin (VANCOCIN) IVPB 1000 mg/200 mL premix (has no administration in time range)  ceFEPIme (MAXIPIME) 2  g in sodium chloride 0.9 % 100 mL IVPB ( Intravenous Stopped 01/10/20 1847)  vancomycin (VANCOCIN) IVPB 1000 mg/200 mL premix ( Intravenous Stopped 01/10/20 1517)  sodium chloride (PF) 0.9 % injection (  Given by Other 01/10/20 1520)  iohexol (OMNIPAQUE) 300 MG/ML solution 100 mL (100 mLs Intravenous Contrast Given 01/10/20 1427)  sodium chloride 0.9 % bolus 1,000 mL ( Intravenous Stopped 01/10/20 1726)  HYDROcodone-acetaminophen (NORCO/VICODIN) 5-325 MG per tablet 1 tablet (1 tablet Oral Given 01/10/20 1640)  acetaminophen (TYLENOL) tablet 650 mg (650 mg Oral Given 01/10/20 1648)    Mobility walks Low fall risk   Focused Assessments Gen med   R Recommendations: See Admitting Provider Note  Report given to:   Additional Notes:

## 2020-01-11 LAB — CBC
HCT: 25.7 % — ABNORMAL LOW (ref 36.0–46.0)
Hemoglobin: 8.3 g/dL — ABNORMAL LOW (ref 12.0–15.0)
MCH: 29.2 pg (ref 26.0–34.0)
MCHC: 32.3 g/dL (ref 30.0–36.0)
MCV: 90.5 fL (ref 80.0–100.0)
Platelets: 365 10*3/uL (ref 150–400)
RBC: 2.84 MIL/uL — ABNORMAL LOW (ref 3.87–5.11)
RDW: 13.5 % (ref 11.5–15.5)
WBC: 15.2 10*3/uL — ABNORMAL HIGH (ref 4.0–10.5)
nRBC: 0 % (ref 0.0–0.2)

## 2020-01-11 LAB — BASIC METABOLIC PANEL
Anion gap: 8 (ref 5–15)
BUN: 8 mg/dL (ref 6–20)
CO2: 22 mmol/L (ref 22–32)
Calcium: 8.6 mg/dL — ABNORMAL LOW (ref 8.9–10.3)
Chloride: 102 mmol/L (ref 98–111)
Creatinine, Ser: 0.65 mg/dL (ref 0.44–1.00)
GFR calc Af Amer: >60 mL/min (ref >60)
GFR calc non Af Amer: >60 mL/min (ref >60)
Glucose, Bld: 136 mg/dL — ABNORMAL HIGH (ref 70–99)
Potassium: 3.4 mmol/L — ABNORMAL LOW (ref 3.5–5.1)
Sodium: 132 mmol/L — ABNORMAL LOW (ref 135–145)

## 2020-01-11 NOTE — Progress Notes (Signed)
    CC: Fever/chills  Subjective: Patient is lying in bed no significantly new discomfort.  She has 2 drains in place.  The right drain has cloudy serosanguineous fluid there is almost nothing in the left drain, and it is clear serosanguineous fluid.  There is no significant cellulitis this morning she is not overly tender.  Objective: Vital signs in last 24 hours: Temp:  [97.5 F (36.4 C)-102.6 F (39.2 C)] 97.5 F (36.4 C) (08/12 0650) Pulse Rate:  [85-130] 100 (08/12 0650) Resp:  [15-24] 15 (08/12 0650) BP: (94-137)/(57-82) 117/76 (08/12 0650) SpO2:  [96 %-100 %] 100 % (08/12 0650) Weight:  [96.2 kg] 96.2 kg (08/11 1628) Last BM Date: 01/09/20 240 p.o. intake 2400 IV Urine x2 Drain 50 T-max 102.6 >> 97.5 this a.m. Blood pressure stable, she remains slightly tachycardic heart rate in the 100 range. K+ 3.4 Glucose 136 WBC 19.4>> 15.2 H/H 9.8/29.7>> 8.3/25.7 Urinalysis unremarkable JP drainage culture: Gram Stain ABUNDANT WBC PRESENT, PREDOMINANTLY PMN  NO ORGANISMS SEEN  Blood cultures pending also. CT abdomen 8/11: Extensive fat stranding the anterior posterior superficial soft tissue consistent with a history of abdominoplasty, no focal drainable fluid collections within the anterior abdominal wall.  No intra-abdominal extension, cholelithiasis without evidence of cholecystitis.  Intake/Output from previous day: 08/11 0701 - 08/12 0700 In: 2823 [P.O.:240; I.V.:900; IV Piggyback:1658] Out: 50 [Drains:50] Intake/Output this shift:  No intake/output data recorded.  General appearance: alert, cooperative and no distress Resp: Clear to auscultation GI: Soft, she has 2 drains in place the right one has a cloudy serosanguineous fluid.  The left drain has almost no fluid but what is there is clear serosanguineous.  Minimal cellulitis.  She has large Steri-Strip-like dressing over her lower abdomen.  Lab Results:  Recent Labs    01/10/20 1217 01/11/20 0337  WBC 19.4*  15.2*  HGB 9.8* 8.3*  HCT 29.7* 25.7*  PLT 415* 365    BMET Recent Labs    01/10/20 1217 01/11/20 0337  NA 131* 132*  K 3.7 3.4*  CL 101 102  CO2 19* 22  GLUCOSE 183* 136*  BUN 6 8  CREATININE 0.68 0.65  CALCIUM 8.9 8.6*   PT/INR Recent Labs    01/10/20 1217  LABPROT 14.1  INR 1.1    Recent Labs  Lab 01/10/20 1217  AST 19  ALT 37  ALKPHOS 74  BILITOT 1.5*  PROT 7.4  ALBUMIN 3.3*     Lipase     Component Value Date/Time   LIPASE 15 04/01/2012 1710     Medications: . enoxaparin (LOVENOX) injection  40 mg Subcutaneous Q24H   . ceFEPime (MAXIPIME) IV 2 g (01/11/20 0232)  . lactated ringers 125 mL/hr at 01/10/20 1920  . vancomycin 1,000 mg (01/11/20 0541)   Gram Stain ABUNDANT WBC PRESENT, PREDOMINANTLY PMN  NO ORGANISMS SEEN      Assessment/Plan   Abdominal wall cellulitis S/p abdominoplasty 01/03/2020, [place]  FEN: IV fluids/regular diet ID: Maxipime 8/11 >> day 2; vancomycin 8/11>> day 2 DVT: Lovenox Follow-up: Plastic/primary surgery   Plan: Agree with ongoing antibiotic therapy.  Fevers improving on antibiotic therapy.  Cultures are pending.  She should follow-up with Dr. Sharol Harness her physician in [place].  At this point it does not seem like she has plans to go back to [place].  She would need a referral to Plastics here in [place].    LOS: 1 day    Nicole Christian 01/11/2020 Please see Amion

## 2020-01-11 NOTE — Progress Notes (Signed)
Pt currently in red mews protocol since last night for going to into red for elevated HR and temp. PRN interventions successful and red mew protocol continued into day shift. At 1030 vital check, pt back in red mews. RN decided to restart red mew vitals signs frequency. PRN Tylenol & ibuprofen administered. Consulting civil engineer and MD notified. Will continue to monitor.

## 2020-01-11 NOTE — Progress Notes (Signed)
   01/10/20 2037  Assess: MEWS Score  Temp 99.5 F (37.5 C)  BP (!) 122/58  Pulse Rate (!) 124  Resp 15  SpO2 100 %  O2 Device Room Air  Assess: MEWS Score  MEWS Temp 0  MEWS Systolic 0  MEWS Pulse 2  MEWS RR 0  MEWS LOC 0  MEWS Score 2  MEWS Score Color Yellow  Assess: if the MEWS score is Yellow or Red  Were vital signs taken at a resting state? Yes  Focused Assessment Change from prior assessment (see assessment flowsheet)  Early Detection of Sepsis Score *See Row Information* Low  MEWS guidelines implemented *See Row Information* Yes  Treat  MEWS Interventions Other (Comment) (notified on call)  Pain Scale 0-10  Pain Score 6  Pain Type Acute pain  Pain Location Generalized  Pain Descriptors / Indicators Aching  Patients Stated Pain Goal 2  Pain Intervention(s) Medication (See eMAR)  Take Vital Signs  Increase Vital Sign Frequency  Yellow: Q 2hr X 2 then Q 4hr X 2, if remains yellow, continue Q 4hrs  Escalate  MEWS: Escalate Yellow: discuss with charge nurse/RN and consider discussing with provider and RRT  Notify: Charge Nurse/RN  Name of Charge Nurse/RN Notified Debbie  Date Charge Nurse/RN Notified 01/10/20  Time Charge Nurse/RN Notified 2043  Notify: Provider  Provider Name/Title Katherina Right  Date Provider Notified 01/10/20  Time Provider Notified 2037  Notification Type Page  Notification Reason Change in status  Response No new orders  Date of Provider Response  (did not respond)  Document  Patient Outcome Other (Comment) (Baseline status)  yellow mews initiated

## 2020-01-11 NOTE — Progress Notes (Signed)
   01/10/20 2329  Assess: MEWS Score  Temp (!) 102.6 F (39.2 C)  BP 134/71  Pulse Rate (!) 126  Resp 15  SpO2 98 %  O2 Device Room Air  Assess: MEWS Score  MEWS Temp 2  MEWS Systolic 0  MEWS Pulse 2  MEWS RR 0  MEWS LOC 0  MEWS Score 4  MEWS Score Color Red  Assess: if the MEWS score is Yellow or Red  Were vital signs taken at a resting state? Yes  Focused Assessment No change from prior assessment  Early Detection of Sepsis Score *See Row Information* Medium  Treat  Patients response to intervention Unchanged  Document  Patient Outcome Other (Comment) (stable no complaints noted)  red mews initiated

## 2020-01-11 NOTE — Progress Notes (Addendum)
PROGRESS NOTE    Nicole Christian  XBL:[id number]  DOB: April 01, 1981  PCP: Arvilla Market, DO Admit date:01/10/2020 Chief compliant: fever chills 39 y.o. female with no significant past medical history (does not take any chronic medications) who recently underwent tummy tuck/abdominoplasty in [place] presents with postsurgical complications.Patient states she underwent abdominoplasty 1 week ago in same-day surgery unit in [place] followed by 4-day stay in the recovery house with nursing care.  She received oral antibiotics while there and was discharged with bilateral abdominal drains. Patient developed fever with chills yesterday and also noted an area of tenderness/redness/induration along right lower abdomen/groin around the right JP drain.  She started feeling dizzy today and presented to the ED. ED Course:  T-max 100.12F, heart rate 1 10-1 30, respiratory rate 16-24, BP 94/67-133/77, O2 sat 96 to 100% on room air.  Labs show WBC 19.4, hemoglobin 9.8, HCT 29.7, platelet 415, sodium 131, potassium 3.7, chloride 101, bicarb 19, BUN 6, creatinine 0.68, glucose 183 and INR 1.1.  Lactate 1.8->1.0.  Patient received one dose of IV vancomycin in the ED.  EDP discussed with general surgery/plastic surgery Dr. Roosevelt Locks recommended admit to medicine and they will consult. Hospital course: Patient admitted to [facility] with IV cefepime and vancomycin.  Blood and wound cultures sent off.  Subjective:  Patient febrile and tachycardic again this morning.  Objective: Vitals:   01/11/20 1124 01/11/20 1235 01/11/20 1331 01/11/20 1428  BP: 116/63 110/63 (!) 117/59 108/66  Pulse: (!) 121 (!) 121 (!) 113 (!) 110  Resp: 18 19 17 17   Temp: (!) 101.3 F (38.5 C) 100.1 F (37.8 C) 98.5 F (36.9 C) 98.4 F (36.9 C)  TempSrc: Oral Oral Oral Oral  SpO2: 98% 99% 100% 100%  Weight:      Height:        Intake/Output Summary (Last 24 hours) at 01/11/2020 1623 Last data filed at 01/11/2020  1500 Gross per 24 hour  Intake 4320.24 ml  Output 50 ml  Net 4270.24 ml   Filed Weights   01/10/20 1628  Weight: 96.2 kg    Physical Examination:  General: Moderately built, no acute distress noted Head ENT: Atraumatic normocephalic, PERRLA, neck supple Heart: S1-S2 heard, regular rate and rhythm, no murmurs.  No leg edema noted Lungs: Equal air entry bilaterally, no rhonchi or rales on exam, no accessory muscle use Abdomen: Surgical smiling incision in lower abdomen (hip to hip) with absorbable sutures and Steri-Strips in place.  Bilateral drains with right JP drain showing purulent drainage and left JP drain showing sanguinous drainage as below.  Also area of redness and induration (marked as shown below) along right lower abdomen/inguinal area.  No fluctuant swelling. Extremities: No pedal edema.  No cyanosis or clubbing. Neurological: Awake alert oriented x3, no focal weakness or numbness, strength and sensations to crude touch intact Skin: Area of cellulitis/induration as shown below with bilateral JP drains    Data Reviewed: I have personally reviewed following labs and imaging studies  CBC: Recent Labs  Lab 01/10/20 1217 01/11/20 0337  WBC 19.4* 15.2*  NEUTROABS 17.0*  --   HGB 9.8* 8.3*  HCT 29.7* 25.7*  MCV 89.5 90.5  PLT 415* 365   Basic Metabolic Panel: Recent Labs  Lab 01/10/20 1217 01/11/20 0337  NA 131* 132*  K 3.7 3.4*  CL 101 102  CO2 19* 22  GLUCOSE 183* 136*  BUN 6 8  CREATININE 0.68 0.65  CALCIUM 8.9 8.6*   GFR: Estimated Creatinine Clearance:  116.7 mL/min (by C-G formula based on SCr of 0.65 mg/dL). Liver Function Tests: Recent Labs  Lab 01/10/20 1217  AST 19  ALT 37  ALKPHOS 74  BILITOT 1.5*  PROT 7.4  ALBUMIN 3.3*   No results for input(s): LIPASE, AMYLASE in the last 168 hours. No results for input(s): AMMONIA in the last 168 hours. Coagulation Profile: Recent Labs  Lab 01/10/20 1217  INR 1.1   Cardiac Enzymes: No  results for input(s): CKTOTAL, CKMB, CKMBINDEX, TROPONINI in the last 168 hours. BNP (last 3 results) No results for input(s): PROBNP in the last 8760 hours. HbA1C: No results for input(s): HGBA1C in the last 72 hours. CBG: No results for input(s): GLUCAP in the last 168 hours. Lipid Profile: No results for input(s): CHOL, HDL, LDLCALC, TRIG, CHOLHDL, LDLDIRECT in the last 72 hours. Thyroid Function Tests: No results for input(s): TSH, T4TOTAL, FREET4, T3FREE, THYROIDAB in the last 72 hours. Anemia Panel: Recent Labs    01/10/20 2045  FERRITIN 124  TIBC 312  IRON 8*   Sepsis Labs: Recent Labs  Lab 01/10/20 1218 01/10/20 1408  LATICACIDVEN 1.8 1.0    Recent Results (from the past 240 hour(s))  Culture, blood (Routine x 2)     Status: None (Preliminary result)   Collection Time: 01/10/20 12:19 PM   Specimen: BLOOD  Result Value Ref Range Status   Specimen Description   Final    BLOOD RIGHT ANTECUBITAL Performed at [facility], [address], [place], [place] [address]    Special Requests   Final    BOTTLES DRAWN AEROBIC AND ANAEROBIC Blood Culture adequate volume Performed at [facility], [address] [place], [place] [address]    Culture   Final    NO GROWTH < 24 HOURS Performed at [facility], [address] [place], [place] [address]    Report Status PENDING  Incomplete  SARS Coronavirus 2 by RT PCR (hospital order, performed in [facility] hospital lab) Nasopharyngeal Nasopharyngeal Swab     Status: None   Collection Time: 01/10/20  1:36 PM   Specimen: Nasopharyngeal Swab  Result Value Ref Range Status   SARS Coronavirus 2 NEGATIVE NEGATIVE Final    Comment: (NOTE) SARS-CoV-2 target nucleic acids are NOT DETECTED.  The SARS-CoV-2 RNA is generally detectable in upper and lower respiratory specimens during the acute phase of infection. The lowest concentration of SARS-CoV-2 viral copies this assay can detect is 250 copies  / mL. A negative result does not preclude SARS-CoV-2 infection and should not be used as the sole basis for treatment or other patient management decisions.  A negative result may occur with improper specimen collection / handling, submission of specimen other than nasopharyngeal swab, presence of viral mutation(s) within the areas targeted by this assay, and inadequate number of viral copies (<250 copies / mL). A negative result must be combined with clinical observations, patient history, and epidemiological information.  Fact Sheet for Patients:   BoilerBrush.com.cy  Fact Sheet for Healthcare Providers: https://pope.com/  This test is not yet approved or  cleared by the [place] FDA and has been authorized for detection and/or diagnosis of SARS-CoV-2 by FDA under an Emergency Use Authorization (EUA).  This EUA will remain in effect (meaning this test can be used) for the duration of the COVID-19 declaration under Section 564(b)(1) of the Act, 21 U.S.C. section 360bbb-3(b)(1), unless the authorization is terminated or revoked sooner.  Performed at [facility],  [address], [place], [place] [address]   Culture, blood (Routine x 2)     Status: None (Preliminary result)   Collection Time: 01/10/20  1:37 PM   Specimen: BLOOD  Result Value Ref Range Status   Specimen Description   Final    BLOOD SITE NOT SPECIFIED Performed at [facility], [address] [place], [place] [address]    Special Requests   Final    BOTTLES DRAWN AEROBIC AND ANAEROBIC Blood Culture results may not be optimal due to an excessive volume of blood received in culture bottles Performed at [facility], [address] [place], [place] [address]    Culture   Final    NO GROWTH < 12 HOURS Performed at [facility], [address] [place], [place] [address]    Report Status PENDING  Incomplete   Aerobic/Anaerobic Culture (surgical/deep wound)     Status: None (Preliminary result)   Collection Time: 01/10/20  6:28 PM   Specimen: JP Drain; Wound  Result Value Ref Range Status   Specimen Description   Final    JP DRAINAGE Performed at [facility], [address] [place], [place] [address]    Special Requests   Final    NONE Performed at [facility], [address] [place], [place] [address]    Gram Stain   Final    ABUNDANT WBC PRESENT, PREDOMINANTLY PMN NO ORGANISMS SEEN    Culture   Final    TOO YOUNG TO READ Performed at [facility], [address] [place], [place] [address]    Report Status PENDING  Incomplete      Radiology Studies: DG Chest 2 View  Result Date: 01/10/2020 CLINICAL DATA:  Fever EXAM: CHEST - 2 VIEW COMPARISON:  August 18, 2004. FINDINGS: The cardiomediastinal silhouette is normal in contour. Trace fluid in the RIGHT minor fissure. No pleural effusion. No pneumothorax. No acute pleuroparenchymal abnormality. Visualized abdomen is unremarkable. No acute osseous abnormality noted. IMPRESSION: No acute cardiopulmonary abnormality. Electronically Signed   By: Meda Klinefelter MD   On: 01/10/2020 12:48   CT ABDOMEN PELVIS W CONTRAST  Result Date: 01/10/2020 CLINICAL DATA:  Recent tummy tuck and fever EXAM: CT ABDOMEN AND PELVIS WITH CONTRAST TECHNIQUE: Multidetector CT imaging of the abdomen and pelvis was performed using the standard protocol following bolus administration of intravenous contrast. CONTRAST:  OMNIPAQUE IOHEXOL 300 MG/ML  SOLN COMPARISON:  None. FINDINGS: Lower chest: Bibasilar atelectasis. Hepatobiliary: Focal fatty deposition adjacent to the falciform ligament. Cholelithiasis. No intrahepatic or extrahepatic biliary ductal dilation. Pancreas: Unremarkable. No pancreatic ductal dilatation or surrounding inflammatory changes. Spleen: Normal in size without focal abnormality.  Adrenals/Urinary Tract: Adrenal glands are unremarkable. Kidneys are normal, without renal calculi, focal lesion, or hydronephrosis. Bladder is unremarkable. Stomach/Bowel: Stomach is within normal limits. No evidence of appendicitis. No evidence of bowel wall thickening, distention, or inflammatory changes. Vascular/Lymphatic: No significant vascular findings are present. No enlarged abdominal or pelvic lymph nodes. Reproductive: 2 cm cystic lesion of the RIGHT adnexa likely a dominant follicle. Fibroid uterus. Other: Extensive fat stranding of the anterior and posterior soft tissues consistent with history of recent abdominoplasty. There are 2 surgical drains in the abdomen. No focal drainable fluid collection within the anterior abdominal wall. There is trace fluid more focally confluent adjacent to the umbilicus. There is trace subcutaneous air along the tracks of the surgical drains. Musculoskeletal: No acute or significant osseous  findings. IMPRESSION: 1. Extensive fat stranding of the anterior and posterior superficial soft tissues consistent with history of recent abdominoplasty. No focal drainable fluid collection within the anterior abdominal wall. No intra-abdominal extension. 2. Cholelithiasis without evidence of cholecystitis. Electronically Signed   By: Meda KlinefelterStephanie  Peacock MD   On: 01/10/2020 15:19      Scheduled Meds: . enoxaparin (LOVENOX) injection  40 mg Subcutaneous Q24H   Continuous Infusions: . ceFEPime (MAXIPIME) IV 2 g (01/11/20 1049)  . lactated ringers 125 mL/hr at 01/11/20 1043  . vancomycin 1,000 mg (01/11/20 1401)     Assessment/Plan:  1. Sepsis due to surgical wound infection: Patient febrile, tachycardic, tachypneic with borderline hypotension (94/67) on presentation with leukocytosis, thrombocytosis on labs meeting sepsis criteria.  Lactate normal.  Source is abdominal wound infection.  Continue IV cefepime/vancomycin and follow-up blood culture/wound culture results.   Continue IV fluids.  Normotensive now but still febrile and tachycardic.  Right-sided abdominal drain still with purulent fluid.   2.  Nonhealing postsurgical abdominal wound: Patient [place] and apparently (strangely) discharged with abdominal drain in place to follow-up with PCP here in [place].  Patient has had abnormal drainage with pain especially on the right side for few days now with chills leading to problem #1. ED physician discussed with plastic surgery/general surgery-for now general surgery will consult and anticipate them touching base with plastic surgery to come up with a plan in a.m. no drainable abscess noted on CT. however they have evaluated patient today and have signed off stating that patient should follow[place] primary [place] in [place] upon discharge or new referral to plastic surgery to be placed on discharge.  3.  Mild hyperbilirubinemia: Likely related to cholestasis in the setting of acute infection.  Monitor for now  4.  Acute normocytic anemia: Patient's baseline hemoglobin appears to be around 12-13 (last level checked in July).  Labs show drop in hemoglobin to 9.8->8.5 today.  Could be secondary to blood loss from recent surgery as well as slow oozing into JP drains.  Also noted to have reactive thrombocytosis.  Monitor for now and transfuse as needed.  Patient agreeable  5.  Mild hyponatremia/non-anion gap acidosis: Likely secondary to volume depletion.    Improving with IV hydration, repeat labs in AM.   DVT prophylaxis: DC Lovenox given dropping hemoglobin levels, SCDs Code Status: Full code Family / Patient Communication: Discussed with patient and significant other at bedside Disposition Plan:   Status is: Inpatient  Remains inpatient appropriate because:IV treatments appropriate due to intensity of illness or inability to take PO   Dispo: The patient is from: Home              Anticipated d/c is to: Home               Anticipated d/c date is: > 3 days              Patient currently is not medically stable to d/c.            Time spent: 35 minutes     >50% time spent in discussions with care team and coordination of care.    Alessandra BevelsNeelima Ayyub Krall, MD [facility] Pager in [place]  If 7PM-7AM, please contact night-coverage [email or web address] 01/11/2020, 4:23 PM

## 2020-01-12 LAB — BASIC METABOLIC PANEL
Anion gap: 9 (ref 5–15)
BUN: 6 mg/dL (ref 6–20)
CO2: 24 mmol/L (ref 22–32)
Calcium: 8.5 mg/dL — ABNORMAL LOW (ref 8.9–10.3)
Chloride: 103 mmol/L (ref 98–111)
Creatinine, Ser: 0.73 mg/dL (ref 0.44–1.00)
GFR calc Af Amer: >60 mL/min (ref >60)
GFR calc non Af Amer: >60 mL/min (ref >60)
Glucose, Bld: 113 mg/dL — ABNORMAL HIGH (ref 70–99)
Potassium: 3.2 mmol/L — ABNORMAL LOW (ref 3.5–5.1)
Sodium: 136 mmol/L (ref 135–145)

## 2020-01-12 MED ORDER — POTASSIUM CHLORIDE IN NACL 20-0.9 MEQ/L-% IV SOLN
INTRAVENOUS | Status: DC
  Filled 2020-01-12 (×2): qty 1000

## 2020-01-12 MED ORDER — POTASSIUM CHLORIDE 20 MEQ PO PACK
40.0000 meq | PACK | Freq: Once | ORAL | Status: AC
  Administered 2020-01-12: 40 meq via ORAL
  Filled 2020-01-12: qty 2

## 2020-01-12 NOTE — Progress Notes (Signed)
   01/12/20 1429  Vitals  Temp (!) 102.8 F (39.3 C)  BP 131/63  MAP (mmHg) 82  BP Location Right Arm  BP Method Automatic  Patient Position (if appropriate) Lying  Pulse Rate (!) 118  Resp 19  MEWS COLOR  MEWS Score Color Red  Oxygen Therapy  SpO2 98 %  MEWS Score  MEWS Temp 2  MEWS Systolic 0  MEWS Pulse 2  MEWS RR 0  MEWS LOC 0  MEWS Score 4   md notified.  Given tylenol.  In no acute distress.  Change to red mews VS.  Will continue to monitor.

## 2020-01-12 NOTE — Progress Notes (Addendum)
PROGRESS NOTE    Nicole Christian  ZOX:[id number]  DOB: 08-06-1980  PCP: Arvilla Market, DO Admit date:01/10/2020 Chief compliant: fever chills 39 y.o. female with no significant past medical history (does not take any chronic medications) who recently underwent tummy tuck/abdominoplasty in [place] presents with postsurgical complications.Patient states she underwent abdominoplasty 1 week ago in same-day surgery unit in [place] followed by 4-day stay in the recovery house with nursing care.  She received oral antibiotics while there and was discharged with bilateral abdominal drains. Patient developed fever with chills yesterday and also noted an area of tenderness/redness/induration along right lower abdomen/groin around the right JP drain.  She started feeling dizzy today and presented to the ED. ED Course:  T-max 100.42F, heart rate 1 10-1 30, respiratory rate 16-24, BP 94/67-133/77, O2 sat 96 to 100% on room air.  Labs show WBC 19.4, hemoglobin 9.8, HCT 29.7, platelet 415, sodium 131, potassium 3.7, chloride 101, bicarb 19, BUN 6, creatinine 0.68, glucose 183 and INR 1.1.  Lactate 1.8->1.0.  Patient received one dose of IV vancomycin in the ED.  EDP discussed with general surgery/plastic surgery Dr. Roosevelt Locks recommended admit to medicine and they will consult. Hospital course: Patient admitted to [facility] with IV cefepime and vancomycin.  Blood and wound cultures sent off.  Subjective:  Patient talking on the phone and appears to be in good spirits.  Afebrile this morning.  Still tachycardic.  She states she feels anxious with IV medications.  Right JP drain still with turbid output Objective: Vitals:   01/12/20 0229 01/12/20 0629 01/12/20 0836 01/12/20 1029  BP: 123/64 111/60 129/71 119/70  Pulse: (!) 113 (!) 112 (!) 118 (!) 117  Resp: 18 16 19 18   Temp: 99.6 F (37.6 C) 99.5 F (37.5 C) 99.9 F (37.7 C) 99.8 F (37.7 C)  TempSrc: Oral Oral Oral Oral  SpO2: 100% 98%  99% 97%  Weight:      Height:        Intake/Output Summary (Last 24 hours) at 01/12/2020 1333 Last data filed at 01/11/2020 1500 Gross per 24 hour  Intake 786.05 ml  Output --  Net 786.05 ml   Filed Weights   01/10/20 1628  Weight: 96.2 kg    Physical Examination:  General: Moderately built, no acute distress noted Head ENT: Atraumatic normocephalic, PERRLA, neck supple Heart: S1-S2 heard, regular rate and rhythm, no murmurs.  No leg edema noted Lungs: Equal air entry bilaterally, no rhonchi or rales on exam, no accessory muscle use Abdomen: Surgical smiling incision in lower abdomen (hip to hip) with absorbable sutures and Steri-Strips in place.  Bilateral drains with right JP drain showing purulent drainage and left JP drain showing sanguinous drainage as below.  Also area of redness and induration (marked as shown below) along right lower abdomen/inguinal area.  No fluctuant swelling. Extremities: No pedal edema.  No cyanosis or clubbing. Neurological: Awake alert oriented x3, no focal weakness or numbness, strength and sensations to crude touch intact Skin: Area of cellulitis/induration as shown below with bilateral JP drains-induration and redness improving slowly since admission    Data Reviewed: I have personally reviewed following labs and imaging studies  CBC: Recent Labs  Lab 01/10/20 1217 01/11/20 0337  WBC 19.4* 15.2*  NEUTROABS 17.0*  --   HGB 9.8* 8.3*  HCT 29.7* 25.7*  MCV 89.5 90.5  PLT 415* 365   Basic Metabolic Panel: Recent Labs  Lab 01/10/20 1217 01/11/20 0337  NA 131* 132*  K 3.7  3.4*  CL 101 102  CO2 19* 22  GLUCOSE 183* 136*  BUN 6 8  CREATININE 0.68 0.65  CALCIUM 8.9 8.6*   GFR: Estimated Creatinine Clearance: 116.7 mL/min (by C-G formula based on SCr of 0.65 mg/dL). Liver Function Tests: Recent Labs  Lab 01/10/20 1217  AST 19  ALT 37  ALKPHOS 74  BILITOT 1.5*  PROT 7.4  ALBUMIN 3.3*   No results for input(s): LIPASE,  AMYLASE in the last 168 hours. No results for input(s): AMMONIA in the last 168 hours. Coagulation Profile: Recent Labs  Lab 01/10/20 1217  INR 1.1   Cardiac Enzymes: No results for input(s): CKTOTAL, CKMB, CKMBINDEX, TROPONINI in the last 168 hours. BNP (last 3 results) No results for input(s): PROBNP in the last 8760 hours. HbA1C: No results for input(s): HGBA1C in the last 72 hours. CBG: No results for input(s): GLUCAP in the last 168 hours. Lipid Profile: No results for input(s): CHOL, HDL, LDLCALC, TRIG, CHOLHDL, LDLDIRECT in the last 72 hours. Thyroid Function Tests: No results for input(s): TSH, T4TOTAL, FREET4, T3FREE, THYROIDAB in the last 72 hours. Anemia Panel: Recent Labs    01/10/20 2045  FERRITIN 124  TIBC 312  IRON 8*   Sepsis Labs: Recent Labs  Lab 01/10/20 1218 01/10/20 1408  LATICACIDVEN 1.8 1.0    Recent Results (from the past 240 hour(s))  Culture, blood (Routine x 2)     Status: None (Preliminary result)   Collection Time: 01/10/20 12:19 PM   Specimen: BLOOD  Result Value Ref Range Status   Specimen Description   Final    BLOOD RIGHT ANTECUBITAL Performed at [facility], [address] [place], [place] [address]    Special Requests   Final    BOTTLES DRAWN AEROBIC AND ANAEROBIC Blood Culture adequate volume Performed at [facility], [address] [place], [place] [address]    Culture   Final    NO GROWTH 2 DAYS Performed at [facility], [address] [place], [place] [address]    Report Status PENDING  Incomplete  SARS Coronavirus 2 by RT PCR (hospital order, performed in [facility] hospital lab) Nasopharyngeal Nasopharyngeal Swab     Status: None   Collection Time: 01/10/20  1:36 PM   Specimen: Nasopharyngeal Swab  Result Value Ref Range Status   SARS Coronavirus 2 NEGATIVE NEGATIVE Final    Comment: (NOTE) SARS-CoV-2 target nucleic acids are NOT DETECTED.  The SARS-CoV-2 RNA is generally  detectable in upper and lower respiratory specimens during the acute phase of infection. The lowest concentration of SARS-CoV-2 viral copies this assay can detect is 250 copies / mL. A negative result does not preclude SARS-CoV-2 infection and should not be used as the sole basis for treatment or other patient management decisions.  A negative result may occur with improper specimen collection / handling, submission of specimen other than nasopharyngeal swab, presence of viral mutation(s) within the areas targeted by this assay, and inadequate number of viral copies (<250 copies / mL). A negative result must be combined with clinical observations, patient history, and epidemiological information.  Fact Sheet for Patients:   BoilerBrush.com.cyhttps://www.fda.gov/media/136312/download  Fact Sheet for Healthcare Providers: https://pope.com/https://www.fda.gov/media/136313/download  This test is not yet approved or  cleared by the [place] FDA and has been authorized for detection and/or diagnosis of SARS-CoV-2 by FDA under an Emergency Use Authorization (EUA).  This EUA will remain in effect (meaning this test can be used) for the  duration of the COVID-19 declaration under Section 564(b)(1) of the Act, 21 U.S.C. section 360bbb-3(b)(1), unless the authorization is terminated or revoked sooner.  Performed at [facility], [address], [place], [place] [address]   Culture, blood (Routine x 2)     Status: None (Preliminary result)   Collection Time: 01/10/20  1:37 PM   Specimen: BLOOD  Result Value Ref Range Status   Specimen Description   Final    BLOOD SITE NOT SPECIFIED Performed at [facility], [address], [place], [place] [address]    Special Requests   Final    BOTTLES DRAWN AEROBIC AND ANAEROBIC Blood Culture results may not be optimal due to an excessive volume of blood received in culture bottles Performed at [facility], [address]  [place], [place] [address]    Culture   Final    NO GROWTH 2 DAYS Performed at [facility], [address] [place], [place] [address]    Report Status PENDING  Incomplete  Aerobic/Anaerobic Culture (surgical/deep wound)     Status: None (Preliminary result)   Collection Time: 01/10/20  6:28 PM   Specimen: JP Drain; Wound  Result Value Ref Range Status   Specimen Description   Final    JP DRAINAGE Performed at [facility], [address], [place], [place] [address]    Special Requests   Final    NONE Performed at [facility], [address] [place], [place] [address]    Gram Stain   Final    ABUNDANT WBC PRESENT, PREDOMINANTLY PMN NO ORGANISMS SEEN Performed at [facility], [address], [place], [place] [address]    Culture   Final    ABUNDANT STAPHYLOCOCCUS AUREUS SUSCEPTIBILITIES TO FOLLOW NO ANAEROBES ISOLATED; CULTURE IN PROGRESS FOR 5 DAYS    Report Status PENDING  Incomplete      Radiology Studies: CT ABDOMEN PELVIS W CONTRAST  Result Date: 01/10/2020 CLINICAL DATA:  Recent tummy tuck and fever EXAM: CT ABDOMEN AND PELVIS WITH CONTRAST TECHNIQUE: Multidetector CT imaging of the abdomen and pelvis was performed using the standard protocol following bolus administration of intravenous contrast. CONTRAST:  OMNIPAQUE IOHEXOL 300 MG/ML  SOLN COMPARISON:  None. FINDINGS: Lower chest: Bibasilar atelectasis. Hepatobiliary: Focal fatty deposition adjacent to the falciform ligament. Cholelithiasis. No intrahepatic or extrahepatic biliary ductal dilation. Pancreas: Unremarkable. No pancreatic ductal dilatation or surrounding inflammatory changes. Spleen: Normal in size without focal abnormality. Adrenals/Urinary Tract: Adrenal glands are unremarkable. Kidneys are normal, without renal calculi, focal lesion, or hydronephrosis. Bladder is unremarkable. Stomach/Bowel: Stomach is within normal limits. No evidence of appendicitis. No  evidence of bowel wall thickening, distention, or inflammatory changes. Vascular/Lymphatic: No significant vascular findings are present. No enlarged abdominal or pelvic lymph nodes. Reproductive: 2 cm cystic lesion of the RIGHT adnexa likely a dominant follicle. Fibroid uterus. Other: Extensive fat stranding of the anterior and posterior soft tissues consistent with history of recent abdominoplasty. There are 2 surgical drains in the abdomen. No focal drainable fluid collection within the anterior abdominal wall. There is trace fluid more focally confluent adjacent to the umbilicus. There is trace subcutaneous air along the tracks of the surgical drains. Musculoskeletal: No acute or significant osseous findings. IMPRESSION: 1. Extensive fat stranding of the anterior and posterior superficial soft tissues consistent with history of recent abdominoplasty. No focal drainable fluid collection within the anterior abdominal wall. No intra-abdominal extension. 2. Cholelithiasis without  evidence of cholecystitis. Electronically Signed   By: Meda Klinefelter MD   On: 01/10/2020 15:19      Scheduled Meds:  Continuous Infusions: . lactated ringers 125 mL/hr at 01/12/20 0420  . vancomycin 1,000 mg (01/12/20 7824)     Assessment/Plan:  1. Sepsis due to surgical wound infection: Patient febrile, tachycardic, tachypneic with borderline hypotension (94/67) on presentation with leukocytosis, thrombocytosis on labs meeting sepsis criteria.  Lactate normal.  Source is abdominal wound infection.  Admitted with IV cefepime/vancomycin.  Blood cultures so far with no growth.  JP drain output culture growing abundant staph aureus, no anaerobes.  Will DC IV cefepime and monitor response to IV vancomycin until more data available.  Continue IV fluids.  Normotensive now but still tachycardic.  Right-sided abdominal drain still with chylous/turbid output.   2.  Nonhealing postsurgical abdominal wound: Patient had tummy  tuck/abdominoplasty done in [place] and apparently (strangely) discharged with abdominal drain in place to follow-up with PCP here in [place].  Patient has had abnormal drainage with pain especially on the right side for few days now with chills leading to problem #1. ED physician discussed with plastic surgery/general surgery-plan for general surgery to consult anticipating them touching base with plastic surgery to come up with a plan. No drainable abscess noted on CT. GS evaluated patient 8/12 and have signed off stating that patient should follow-up primary surgeon in [place] upon discharge or new referral to plastic surgery to be placed on discharge. Sent a message to Dr Ulice Bold regarding further advice.  Addendum 2.30PM: Discussed with Dr. Ulice Bold who recommended to continue antibiotics for 10 days course (p.o. or IV) and advised outpatient follow-up with Dr. Arita Miss or herself upon discharge (patient to call [phone] for appointment)  3. Mild hyperbilirubinemia: Likely related to cholestasis in the setting of acute infection.  Monitor for now  4.  Acute normocytic anemia: Patient's baseline hemoglobin appears to be around 12-13 (last level checked in July).  Labs show drop in hemoglobin to 9.8->8.5 today.  Could be secondary to blood loss from recent surgery as well as slow oozing into JP drains. D/c Lovenox.  Also noted to have reactive thrombocytosis.  Monitor for now (labs ordered)  and transfuse as needed.  Patient agreeable  5.  Mild hyponatremia/non-anion gap acidosis: Likely secondary to volume depletion.    Improved with IV hydration.    DVT prophylaxis: DC Lovenox given dropping hemoglobin levels, SCDs Code Status: Full code Family / Patient Communication: Discussed with patient  Disposition Plan:   Status is: Inpatient  Remains inpatient appropriate because:IV treatments appropriate due to intensity of illness or inability to take PO   Dispo: The patient is from: Home               Anticipated d/c is to: Home              Anticipated d/c date is:  3-4 days based on final cx results              Patient currently is not medically stable to d/c.    Time spent: 35 minutes >50% time spent in discussions with care team and coordination of care.    Alessandra Bevels, MD [facility] Pager in [place]  If 7PM-7AM, please contact night-coverage [email or web address] 01/12/2020, 1:33 PM

## 2020-01-13 DIAGNOSIS — T8149XA Infection following a procedure, other surgical site, initial encounter: Secondary | ICD-10-CM

## 2020-01-13 LAB — BASIC METABOLIC PANEL
Anion gap: 8 (ref 5–15)
BUN: 6 mg/dL (ref 6–20)
CO2: 22 mmol/L (ref 22–32)
Calcium: 8.1 mg/dL — ABNORMAL LOW (ref 8.9–10.3)
Chloride: 103 mmol/L (ref 98–111)
Creatinine, Ser: 0.54 mg/dL (ref 0.44–1.00)
GFR calc Af Amer: >60 mL/min (ref >60)
GFR calc non Af Amer: >60 mL/min (ref >60)
Glucose, Bld: 104 mg/dL — ABNORMAL HIGH (ref 70–99)
Potassium: 3.5 mmol/L (ref 3.5–5.1)
Sodium: 133 mmol/L — ABNORMAL LOW (ref 135–145)

## 2020-01-13 LAB — CBC
HCT: 25.4 % — ABNORMAL LOW (ref 36.0–46.0)
Hemoglobin: 8.2 g/dL — ABNORMAL LOW (ref 12.0–15.0)
MCH: 29.2 pg (ref 26.0–34.0)
MCHC: 32.3 g/dL (ref 30.0–36.0)
MCV: 90.4 fL (ref 80.0–100.0)
Platelets: 450 10*3/uL — ABNORMAL HIGH (ref 150–400)
RBC: 2.81 MIL/uL — ABNORMAL LOW (ref 3.87–5.11)
RDW: 13.6 % (ref 11.5–15.5)
WBC: 13.5 10*3/uL — ABNORMAL HIGH (ref 4.0–10.5)
nRBC: 0 % (ref 0.0–0.2)

## 2020-01-13 MED ORDER — FLUCONAZOLE 150 MG PO TABS
150.0000 mg | ORAL_TABLET | ORAL | 0 refills | Status: DC | PRN
Start: 2020-01-13 — End: 2020-01-30

## 2020-01-13 MED ORDER — CEPHALEXIN 500 MG PO CAPS: 500.0000 mg | ORAL_CAPSULE | Freq: Four times a day (QID) | ORAL | 0 refills | Status: AC

## 2020-01-13 MED ORDER — ONDANSETRON 4 MG PO TBDP
4.0000 mg | ORAL_TABLET | Freq: Three times a day (TID) | ORAL | 0 refills | Status: DC | PRN
Start: 2020-01-13 — End: 2020-01-30

## 2020-01-13 MED ORDER — CEPHALEXIN 500 MG PO CAPS: 500.0000 mg | ORAL_CAPSULE | Freq: Four times a day (QID) | ORAL | 0 refills | Status: DC

## 2020-01-13 NOTE — Discharge Instructions (Signed)
Wound Infection A wound infection happens when germs start to grow in a wound. Germs that cause wound infections are most often bacteria. Other types of infections can occur as well. An infection can cause the wound to break open. Wound infections need treatment. If a wound infection is not treated, problems can happen. What are the causes?  Most often caused by germs (bacteria) that grow in a wound.  Other germs, such as yeast and funguses, can also cause wound infections. What increases the risk?  Having a weak body defense system (immune system).  Having diabetes.  Taking certain medicines (steroids) for a long time.  Smoking.  Being an older person.  Being overweight.  Taking certain medicines for cancer treatment. What are the signs or symptoms?  Having more redness, swelling, or pain at the wound site.  Having more blood or fluid at the wound site.  A bad smell coming from a wound or bandage (dressing).  Having a fever.  Feeling very tired.  Having warmth at or around the wound.  Having pus at the wound site. How is this treated?  This condition is most often treated with an antibiotic medicine. ? The infection should improve 24-48 hours after you start antibiotics. ? After 24-48 hours, redness around the wound should stop spreading. The wound should also be less painful. Follow these instructions at home: Medicines  Take or apply over-the-counter and prescription medicines only as told by your doctor.  If you were prescribed an antibiotic medicine, take or apply it as told by your doctor. Do not stop using the antibiotic even if you start to feel better. Wound care   Clean the wound each day, or as told by your doctor. ? Wash the wound with mild soap and water. ? Rinse the wound with water to remove all soap. ? Pat the wound dry with a clean towel. Do not rub it.  Follow instructions from your doctor about how to take care of your wound. Make sure  you: ? Wash your hands with soap and water before and after you change your bandage. If you cannot use soap and water, use hand sanitizer. ? Change your bandage as told by your doctor. ? Leave stitches (sutures), skin glue, or skin tape (adhesive) strips in place if your wound has been closed. They may need to stay in place for 2 weeks or longer. If tape strips get loose and curl up, you may trim the loose edges. Do not remove tape strips completely unless your doctor says it is okay. Some wounds are left open to heal on their own.  Check your wound every day for signs of infection. Watch for: ? More redness, swelling, or pain. ? More fluid or blood. ? Warmth. ? Pus or a bad smell. General instructions  Keep the bandage dry until your doctor says it can be removed.  Do not take baths, swim, or use a hot tub until your doctor approves. Ask your doctor if you may take showers. You may only be allowed to take sponge baths.  Raise (elevate) the injured area above the level of your heart while you are sitting or lying down.  Do not scratch or pick at the wound.  Keep all follow-up visits as told by your doctor. This is important. Contact a doctor if:  Medicine does not help your pain.  You have more redness, swelling, or pain around your wound.  You have more fluid or blood coming from your wound.    Your wound feels warm to the touch.  You have pus coming from your wound.  You notice a bad smell coming from your wound or your bandage.  Your wound that was closed breaks open. Get help right away if:  You have a red streak going away from your wound.  You have a fever. Summary  A wound infection happens when germs start to grow in a wound.  This condition is usually treated with an antibiotic medicine.  Follow instructions from your doctor about how to take care of your wound.  Contact a doctor if your wound infection does not start to get better in 24-48 hours, or your  symptoms get worse.  Keep all follow-up visits as told by your doctor. This is important. This information is not intended to replace advice given to you by your health care provider. Make sure you discuss any questions you have with your health care provider. Document Revised: 12/28/2017 Document Reviewed: 12/28/2017 Elsevier Patient Education  2020 [employer]  

## 2020-01-13 NOTE — Discharge Summary (Signed)
Physician Discharge Summary   Patient ID: Nicole Christian MRN: [id number] DOB/AGE: [birth year] 38 y.o.  Admit date: 01/10/2020 Discharge date: 01/13/2020  Primary Care Physician:  Arvilla Market, DO   Recommendations for Outpatient Follow-up:  1. Follow up with PCP in 1-2 weeks as needed 2. Patient recommended to call Dr. Kittie Plater office on Monday, 01/15/2020 for follow-up 3. Continue Keflex 500mg  PO QID x 10 days   Home Health: None Equipment/Devices:   Discharge Condition: stable, at baseline CODE STATUS: FULL Diet recommendation: Heart healthy   Discharge Diagnoses:    Sepsis secondary to surgical wound infection, nonhealing MSSA wound infection Mild hyperbilirubinemia Acute normocytic anemia: Mild hyponatremia/non-anion gap acidosis   Consults:   General surgery  Plastic surgery via phone    Allergies:  No Known Allergies   DISCHARGE MEDICATIONS: Allergies as of 01/13/2020   No Known Allergies     Medication List    TAKE these medications   acetaminophen 500 MG tablet Commonly known as: TYLENOL Take 500 mg by mouth every 6 (six) hours as needed for moderate pain.   cephALEXin 500 MG capsule Commonly known as: KEFLEX Take 1 capsule (500 mg total) by mouth 4 (four) times daily for 10 days. What changed: when to take this   fluconazole 150 MG tablet Commonly known as: Diflucan Take 1 tablet (150 mg total) by mouth every 3 (three) days as needed (yeast infection).   ibuprofen 200 MG tablet Commonly known as: ADVIL Take 200 mg by mouth every 6 (six) hours as needed for moderate pain.   ondansetron 4 MG disintegrating tablet Commonly known as: Zofran ODT Take 1 tablet (4 mg total) by mouth every 8 (eight) hours as needed for nausea or vomiting.            Discharge Care Instructions  (From admission, onward)         Start     Ordered   01/13/20 0000  Discharge wound care:       Comments: Please follow-up with Dr  01/15/20 for further instructions   01/13/20 1235   01/13/20 0000  Leave dressing on - Keep it clean, dry, and intact until clinic visit        01/13/20 1235           Brief H and P: For complete details please refer to admission H and P, but in brief *39 y.o.femalewithno significant past medical history (does not take any chronic medications) who recently underwent tummy tuck/abdominoplasty in 01/15/20 presents with postsurgical complications.Patient states she underwent abdominoplasty 1 week ago in same-day surgery unit in [place] followed by 4-day stay in the recovery house with nursing care. She received oral antibiotics while there and was discharged with bilateral abdominal drains. Patient developed fever with chills yesterday and also noted an area of tenderness/redness/induration along right lower abdomen/groin around the right JP drain. She started feeling dizzy today and presented to the ED. ED Course:T-max 100.36F, heart rate 1 10-1 30, respiratory rate 16-24, BP 94/67-133/77, O2 sat 96to 100% on room air. Labs show WBC 19.4, hemoglobin 9.8, HCT 29.7, platelet 415, sodium 131, potassium 3.7, chloride 101, bicarb 19, BUN 6, creatinine 0.68, glucose 183 and INR 1.1. Lactate 1.8->1.0. Patient received one dose of IV vancomycin in the ED. EDP discussed with general surgery/plastic surgery Dr. [place] recommended admit to medicine and they will consult.  Hospital Course:   Sepsis due to surgical wound infection: -Presented with fevers, tachycardia, tachypnea with borderline hypotension 94/67, leukocytosis, thrombocytosis,  source likely due to abdominal wound infection -Patient was placed on IV cefepime, vancomycin.  Blood cultures negative so far.  JP drain output cultures growing staph aureus,Right-sided abdominal drain still with chylous/turbid output. -Cultures positive for MSSA, discussed with infection disease, Dr. Luciana Axeomer.  Reviewed Dr. Kittie Platerillingham's recommendations per  her discussion with Dr. Carmelina NounKameneni, placed on oral Keflex 500 mg p.o. 4 times daily for 10 days.  Patient will follow up with Dr. Ulice Boldillingham next week.  Nonhealing postsurgical wound status post tummy tuck/abdominoplasty -Patient had tum[place]uck[place] in [place], apparently discharged with abdominal drain in place to follow-up with PCP here in [place] -Patient had abdominal drainage with pain especially on the right side for few days with chills, reports that she did not take her antibiotics consistently.  CT did not show drainable abscess. -Continue oral antibiotics and outpatient follow-up with Dr. Ulice Boldillingham.  Mild hyperbilirubinemia:Likely related to cholestasis in the setting of acute infection. Monitor for now  Acute normocytic anemia: Patient's baseline hemoglobin appears to be around 12-13 (last level checked in July). Hemoglobin 8.2, could be secondary to blood loss from recent surgeryas well as slow oozing into JP drains.Also noted to have reactive thrombocytosis.   Mild hyponatremia/non-anion gap acidosis: Likely secondary to volume depletion. Improvedwith IV hydration.  Obesity Estimated body mass index is 32.23 kg/m as calculated from the following:   Height as of this encounter: 5\' 8"  (1.727 m).   Weight as of this encounter: 96.2 kg.     Day of Discharge S: Overall doing well, no fevers or chills, hoping to go home if cultures are available.  No nausea vomiting or diarrhea.  BP 119/64 (BP Location: Left Arm)   Pulse 95   Temp 98.8 F (37.1 C)   Resp 16   Ht 5\' 8"  (1.727 m)   Wt 96.2 kg   LMP 12/26/2019 (Approximate)   SpO2 100%   BMI 32.23 kg/m   Physical Exam: General: Alert and awake oriented x3 not in any acute distress. HEENT: anicteric sclera, pupils reactive to light and accommodation CVS: S1-S2 clear no murmur rubs or gallops Chest: clear to auscultation bilaterally, no wheezing rales or rhonchi Abdomen: soft nontender,  nondistended, normal bowel sounds, see below Extremities: no cyanosis, clubbing or edema noted bilaterally Neuro: Cranial nerves II-XII intact, no focal neurological deficits         Get Medicines reviewed and adjusted: Please take all your medications with you for your next visit with your Primary MD  Please request your Primary MD to go over all hospital tests and procedure/radiological results at the follow up. Please ask your Primary MD to get all Hospital records sent to his/her office.  If you experience worsening of your admission symptoms, develop shortness of breath, life threatening emergency, suicidal or homicidal thoughts you must seek medical attention immediately by calling 911 or calling your MD immediately  if symptoms less severe.  You must read complete instructions/literature along with all the possible adverse reactions/side effects for all the Medicines you take and that have been prescribed to you. Take any new Medicines after you have completely understood and accept all the possible adverse reactions/side effects.   Do not drive when taking pain medications.   Do not take more than prescribed Pain, Sleep and Anxiety Medications  Special Instructions: If you have smoked or chewed Tobacco  in the last 2 yrs please stop smoking, stop any regular Alcohol  and or any Recreational drug use.  Wear Seat belts while driving.  Please note  You were cared for by a hospitalist during your hospital stay. Once you are discharged, your primary care physician will handle any further medical issues. Please note that NO REFILLS for any discharge medications will be authorized once you are discharged, as it is imperative that you return to your primary care physician (or establish a relationship with a primary care physician if you do not have one) for your aftercare needs so that they can reassess your need for medications and monitor your lab values.   The results of  significant diagnostics from this hospitalization (including imaging, microbiology, ancillary and laboratory) are listed below for reference.      Procedures/Studies:  DG Chest 2 View  Result Date: 01/10/2020 CLINICAL DATA:  Fever EXAM: CHEST - 2 VIEW COMPARISON:  August 18, 2004. FINDINGS: The cardiomediastinal silhouette is normal in contour. Trace fluid in the RIGHT minor fissure. No pleural effusion. No pneumothorax. No acute pleuroparenchymal abnormality. Visualized abdomen is unremarkable. No acute osseous abnormality noted. IMPRESSION: No acute cardiopulmonary abnormality. Electronically Signed   By: Meda Klinefelter MD   On: 01/10/2020 12:48   CT ABDOMEN PELVIS W CONTRAST  Result Date: 01/10/2020 CLINICAL DATA:  Recent tummy tuck and fever EXAM: CT ABDOMEN AND PELVIS WITH CONTRAST TECHNIQUE: Multidetector CT imaging of the abdomen and pelvis was performed using the standard protocol following bolus administration of intravenous contrast. CONTRAST:  OMNIPAQUE IOHEXOL 300 MG/ML  SOLN COMPARISON:  None. FINDINGS: Lower chest: Bibasilar atelectasis. Hepatobiliary: Focal fatty deposition adjacent to the falciform ligament. Cholelithiasis. No intrahepatic or extrahepatic biliary ductal dilation. Pancreas: Unremarkable. No pancreatic ductal dilatation or surrounding inflammatory changes. Spleen: Normal in size without focal abnormality. Adrenals/Urinary Tract: Adrenal glands are unremarkable. Kidneys are normal, without renal calculi, focal lesion, or hydronephrosis. Bladder is unremarkable. Stomach/Bowel: Stomach is within normal limits. No evidence of appendicitis. No evidence of bowel wall thickening, distention, or inflammatory changes. Vascular/Lymphatic: No significant vascular findings are present. No enlarged abdominal or pelvic lymph nodes. Reproductive: 2 cm cystic lesion of the RIGHT adnexa likely a dominant follicle. Fibroid uterus. Other: Extensive fat stranding of the anterior and  posterior soft tissues consistent with history of recent abdominoplasty. There are 2 surgical drains in the abdomen. No focal drainable fluid collection within the anterior abdominal wall. There is trace fluid more focally confluent adjacent to the umbilicus. There is trace subcutaneous air along the tracks of the surgical drains. Musculoskeletal: No acute or significant osseous findings. IMPRESSION: 1. Extensive fat stranding of the anterior and posterior superficial soft tissues consistent with history of recent abdominoplasty. No focal drainable fluid collection within the anterior abdominal wall. No intra-abdominal extension. 2. Cholelithiasis without evidence of cholecystitis. Electronically Signed   By: Meda Klinefelter MD   On: 01/10/2020 15:19      LAB RESULTS: Basic Metabolic Panel: Recent Labs  Lab 01/12/20 1350 01/13/20 0338  NA 136 133*  K 3.2* 3.5  CL 103 103  CO2 24 22  GLUCOSE 113* 104*  BUN 6 6  CREATININE 0.73 0.54  CALCIUM 8.5* 8.1*   Liver Function Tests: Recent Labs  Lab 01/10/20 1217  AST 19  ALT 37  ALKPHOS 74  BILITOT 1.5*  PROT 7.4  ALBUMIN 3.3*   No results for input(s): LIPASE, AMYLASE in the last 168 hours. No results for input(s): AMMONIA in the last 168 hours. CBC: Recent Labs  Lab 01/10/20 1217 01/10/20 1217 01/11/20 0337 01/11/20 0337 01/13/20 0338  WBC 19.4*   < >  15.2*  --  13.5*  NEUTROABS 17.0*  --   --   --   --   HGB 9.8*   < > 8.3*  --  8.2*  HCT 29.7*   < > 25.7*  --  25.4*  MCV 89.5   < > 90.5   < > 90.4  PLT 415*   < > 365  --  450*   < > = values in this interval not displayed.   Cardiac Enzymes: No results for input(s): CKTOTAL, CKMB, CKMBINDEX, TROPONINI in the last 168 hours. BNP: Invalid input(s): POCBNP CBG: No results for input(s): GLUCAP in the last 168 hours.     Disposition and Follow-up: Discharge Instructions    Diet - low sodium heart healthy   Complete by: As directed    Discharge wound care:    Complete by: As directed    Please follow-up with Dr Ulice Bold for further instructions   Increase activity slowly   Complete by: As directed    Leave dressing on - Keep it clean, dry, and intact until clinic visit   Complete by: As directed        DISPOSITION: Home   DISCHARGE FOLLOW-UP  Follow-up Information    Peggye Form, DO. Schedule an appointment as soon as possible for a visit in 1 week(s).   Specialty: Plastic Surgery Why: Please call on Monday to make follow-up appointment in next 7 days. Contact information: [address] [place] [address] [phone]        Arvilla Market, DO. Schedule an appointment as soon as possible for a visit in 2 week(s).   Specialty: Family Medicine Why: as needed  Contact information: [address] [place] [address] [phone]                Time coordinating discharge:    Signed:   Thad Ranger M.D. [facility] 01/13/2020, 12:41 PM

## 2020-01-13 NOTE — Progress Notes (Signed)
Pharmacy Antibiotic Note  [name] is a 39 y.o. female admitted on 01/10/2020 with surgical wound infection.  Pharmacy has been consulted for vancomycin dosing.  Plan: Continue vancomycin 1g IV q8h Monitor daily SCr Plan to check trough in the next 24hr if continues  Height: 5\' 8"  (172.7 cm) Weight: 96.2 kg (212 lb) IBW/kg (Calculated) : 63.9  Temp (24hrs), Avg:100 F (37.8 C), Min:98 F (36.7 C), Max:102.8 F (39.3 C)  Recent Labs  Lab 01/10/20 1217 01/10/20 1218 01/10/20 1408 01/11/20 0337 01/12/20 1350 01/13/20 0338  WBC 19.4*  --   --  15.2*  --  13.5*  CREATININE 0.68  --   --  0.65 0.73 0.54  LATICACIDVEN  --  1.8 1.0  --   --   --     Estimated Creatinine Clearance: 116.7 mL/min (by C-G formula based on SCr of 0.54 mg/dL).    No Known Allergies  Antimicrobials this admission: 8/11 vanc >> 8/11 cefepime >> 8/13   Dose adjustments this admission:  Microbiology results: 8/11 BCx: NGTD  8/11 JP drainage: abundant Staph aureus Thank you for allowing pharmacy to be a part of this patient's care.  10/11, PharmD, BCPS Pharmacy: [phone] 01/13/2020 10:23 AM

## 2020-01-15 ENCOUNTER — Telehealth: Payer: Self-pay

## 2020-01-15 ENCOUNTER — Encounter (HOSPITAL_BASED_OUTPATIENT_CLINIC_OR_DEPARTMENT_OTHER): Payer: Self-pay | Admitting: *Deleted

## 2020-01-15 ENCOUNTER — Emergency Department (HOSPITAL_BASED_OUTPATIENT_CLINIC_OR_DEPARTMENT_OTHER)
Admission: EM | Admit: 2020-01-15 | Discharge: 2020-01-15 | Disposition: A | Discharge status: Home / Self Care | Payer: BC Managed Care – PPO | Attending: Emergency Medicine | Admitting: Emergency Medicine

## 2020-01-15 ENCOUNTER — Emergency Department (HOSPITAL_BASED_OUTPATIENT_CLINIC_OR_DEPARTMENT_OTHER): Payer: BC Managed Care – PPO

## 2020-01-15 ENCOUNTER — Other Ambulatory Visit: Payer: Self-pay

## 2020-01-15 DIAGNOSIS — Z4889 Encounter for other specified surgical aftercare: Secondary | ICD-10-CM | POA: Insufficient documentation

## 2020-01-15 DIAGNOSIS — K8020 Calculus of gallbladder without cholecystitis without obstruction: Secondary | ICD-10-CM | POA: Diagnosis not present

## 2020-01-15 DIAGNOSIS — R1900 Intra-abdominal and pelvic swelling, mass and lump, unspecified site: Secondary | ICD-10-CM | POA: Diagnosis not present

## 2020-01-15 DIAGNOSIS — R1909 Other intra-abdominal and pelvic swelling, mass and lump: Secondary | ICD-10-CM | POA: Diagnosis not present

## 2020-01-15 DIAGNOSIS — R188 Other ascites: Secondary | ICD-10-CM | POA: Diagnosis not present

## 2020-01-15 LAB — CULTURE, BLOOD (ROUTINE X 2)
Culture: NO GROWTH
Culture: NO GROWTH
Special Requests: ADEQUATE

## 2020-01-15 LAB — CBC WITH DIFFERENTIAL/PLATELET
Abs Immature Granulocytes: 0.35 10*3/uL — ABNORMAL HIGH (ref 0.00–0.07)
Basophils Absolute: 0 10*3/uL (ref 0.0–0.1)
Basophils Relative: 0 %
Eosinophils Absolute: 0.2 10*3/uL (ref 0.0–0.5)
Eosinophils Relative: 2 %
HCT: 28.2 % — ABNORMAL LOW (ref 36.0–46.0)
Hemoglobin: 8.9 g/dL — ABNORMAL LOW (ref 12.0–15.0)
Immature Granulocytes: 3 %
Lymphocytes Relative: 15 %
Lymphs Abs: 1.8 10*3/uL (ref 0.7–4.0)
MCH: 28.2 pg (ref 26.0–34.0)
MCHC: 31.6 g/dL (ref 30.0–36.0)
MCV: 89.2 fL (ref 80.0–100.0)
Monocytes Absolute: 0.9 10*3/uL (ref 0.1–1.0)
Monocytes Relative: 8 %
Neutro Abs: 8.2 10*3/uL — ABNORMAL HIGH (ref 1.7–7.7)
Neutrophils Relative %: 72 %
Platelets: 623 10*3/uL — ABNORMAL HIGH (ref 150–400)
RBC: 3.16 MIL/uL — ABNORMAL LOW (ref 3.87–5.11)
RDW: 13.7 % (ref 11.5–15.5)
WBC: 11.5 10*3/uL — ABNORMAL HIGH (ref 4.0–10.5)
nRBC: 0 % (ref 0.0–0.2)

## 2020-01-15 LAB — BASIC METABOLIC PANEL
Anion gap: 11 (ref 5–15)
BUN: 9 mg/dL (ref 6–20)
CO2: 24 mmol/L (ref 22–32)
Calcium: 9 mg/dL (ref 8.9–10.3)
Chloride: 100 mmol/L (ref 98–111)
Creatinine, Ser: 0.63 mg/dL (ref 0.44–1.00)
GFR calc Af Amer: >60 mL/min (ref >60)
GFR calc non Af Amer: >60 mL/min (ref >60)
Glucose, Bld: 87 mg/dL (ref 70–99)
Potassium: 4 mmol/L (ref 3.5–5.1)
Sodium: 135 mmol/L (ref 135–145)

## 2020-01-15 LAB — PREGNANCY, URINE: Preg Test, Ur: NEGATIVE

## 2020-01-15 LAB — AEROBIC/ANAEROBIC CULTURE W GRAM STAIN (SURGICAL/DEEP WOUND)

## 2020-01-15 MED ORDER — IOHEXOL 300 MG/ML  SOLN
100.0000 mL | Freq: Once | INTRAMUSCULAR | Status: AC | PRN
  Administered 2020-01-15: 100 mL via INTRAVENOUS

## 2020-01-15 NOTE — Progress Notes (Signed)
Pt's febrile prior to administering night time medications; Morphine administered with Vancomycin prophylactic to reduce arm pain; Pt stated fever always returned after ABT completion. Pt fever did not return, but HR continued to be elevated d/t pt being anxious and working herself up.  Pt remained afebrile during the night, with HR ranging 103-115. Will continue to monitor.

## 2020-01-15 NOTE — ED Notes (Signed)
ED Provider at bedside. 

## 2020-01-15 NOTE — Telephone Encounter (Signed)
Transition Care Management Follow-up Telephone Call Date of discharge and from where: 01/13/2020, [facility]  Patient currently in ED

## 2020-01-15 NOTE — ED Notes (Signed)
Pt voiced concerns on discharge that she is unable to have the area drained in ED, has been having difficulty getting in to see plastic surgeon for follow up as recommended.  Provider aware, discussed follow up options with pt.  Pt left prior to VS recheck stating had to go.

## 2020-01-15 NOTE — ED Provider Notes (Signed)
[facility] EMERGENCY DEPARTMENT Provider Note   CSN: [id number] Arrival date & time: 01/15/20  1225     History Chief Complaint  Patient presents with  . Abscess    Nicole Christian is a 39 y.o. female.  HPI   Patient presents to the emergency department with chief complaint of increasing hardness and swelling of her right abdomen.  Patient recently had a tummy tuck performed in [place] on 08/07.  She was seen in the ED on 08/11 for concerns of cellulitis.  She was admitted to the hospital where she was given IV antibiotics and was discharged on the 15th.  She is on Keflex currently. she denies fever, chills, increasing pain, redness, drainage diarrhea, abdominal pain.  She denies alleviating or aggravating factors.  She has significant medical history of Bell's palsy, ovarian cyst, sciatica.  She denies headache, fever, chills, shortness of breath, chest pain, nausea, vomiting, diarrhea, pedal edema.  Past Medical History:  Diagnosis Date  . Abnormal Pap smear    repeat was fine  . Bell's palsy   . Ovarian cyst   . Sciatica   . Sciatica     Patient Active Problem List   Diagnosis Date Noted  . Wound infection 01/10/2020  . Mixed hyperlipidemia 10/25/2019    Past Surgical History:  Procedure Laterality Date  . LAPAROSCOPIC TUBAL LIGATION  06/28/2012   Procedure: LAPAROSCOPIC TUBAL LIGATION;  Surgeon: Delbert Harness, MD;  Location: [facility];  Service: Gynecology;  Laterality: Bilateral;  . NO PAST SURGERIES    . VAGINAL DELIVERY     X2  . WISDOM TOOTH EXTRACTION       OB History    Gravida  3   Para  3   Term  3   Preterm      AB      Living  3     SAB      TAB      Ectopic      Multiple      Live Births  3           Family History  Problem Relation Age of Onset  . Heart disease Maternal Grandmother        died from heart attack  . Heart attack Maternal Grandmother   . Hypertension Mother   . Diabetes Mother   .  Hypertension Maternal Aunt   . Cancer Maternal Grandfather        lymph  . Other Neg Hx     Social History   Tobacco Use  . Smoking status: Never Smoker  . Smokeless tobacco: Never Used  Vaping Use  . Vaping Use: Never used  Substance Use Topics  . Alcohol use: Yes    Alcohol/week: 1.0 standard drink    Types: 1 Cans of beer per week    Comment: occasional  . Drug use: No    Home Medications Prior to Admission medications   Medication Sig Start Date End Date Taking? Authorizing Provider  acetaminophen (TYLENOL) 500 MG tablet Take 1,000 mg by mouth every 6 (six) hours as needed for moderate pain.    Yes [provider]  cephALEXin (KEFLEX) 500 MG capsule Take 1 capsule (500 mg total) by mouth 4 (four) times daily for 10 days. 01/13/20 01/23/20 Yes Rai, Ripudeep K, MD  ibuprofen (ADVIL) 200 MG tablet Take 600 mg by mouth every 6 (six) hours as needed for moderate pain.    Yes [provider]  fluconazole (  DIFLUCAN) 150 MG tablet Take 1 tablet (150 mg total) by mouth every 3 (three) days as needed (yeast infection). 01/13/20   Rai, Delene Ruffini, MD  ondansetron (ZOFRAN ODT) 4 MG disintegrating tablet Take 1 tablet (4 mg total) by mouth every 8 (eight) hours as needed for nausea or vomiting. 01/13/20   Rai, Delene Ruffini, MD    Allergies    Patient has no known allergies.  Review of Systems   Review of Systems  Constitutional: Negative for chills and fever.  HENT: Negative for congestion.   Respiratory: Negative for shortness of breath.   Cardiovascular: Negative for chest pain.  Gastrointestinal: Negative for abdominal pain, diarrhea, nausea, rectal pain and vomiting.       Admits to abdominal swelling on the right lower quadrant as well as increased hardness.  Genitourinary: Negative for enuresis.  Musculoskeletal: Negative for back pain.  Skin: Negative for rash.  Neurological: Negative for dizziness and headaches.  Hematological: Does not bruise/bleed easily.     Physical Exam Updated Vital Signs BP 120/67 (BP Location: Right Arm)   Pulse 90   Temp 98.1 F (36.7 C) (Oral)   Resp 18   Ht 5\' 8"  (1.727 m)   Wt 90.7 kg   LMP 12/26/2019 (Approximate)   SpO2 99%   BMI 30.41 kg/m   Physical Exam Vitals and nursing note reviewed.  Constitutional:      General: She is not in acute distress.    Appearance: She is not ill-appearing.  HENT:     Head: Normocephalic and atraumatic.     Nose: No congestion.     Mouth/Throat:     Mouth: Mucous membranes are moist.     Pharynx: Oropharynx is clear.  Eyes:     General: No scleral icterus. Cardiovascular:     Rate and Rhythm: Normal rate and regular rhythm.     Pulses: Normal pulses.     Heart sounds: No murmur heard.  No friction rub. No gallop.   Pulmonary:     Effort: No respiratory distress.     Breath sounds: No wheezing, rhonchi or rales.  Abdominal:     General: There is no distension.     Tenderness: There is no abdominal tenderness. There is no guarding.     Comments: Abdomen was visualized, nondistended, normoactive bowel sounds, surgical scar noted at the lower abdomen spanning spine across her entire abdomen, no signs of infection seen at surgical site.  Abdomen was warm to the touch in the right lower quadrant, induration felt, no fluctuance, no noted drainage, purulent discharge, erythema.  Musculoskeletal:        General: No swelling.  Skin:    General: Skin is warm and dry.     Findings: No rash.  Neurological:     Mental Status: She is alert.  Psychiatric:        Mood and Affect: Mood normal.       ED Results / Procedures / Treatments   Labs (all labs ordered are listed, but only abnormal results are displayed) Labs Reviewed  CBC WITH DIFFERENTIAL/PLATELET - Abnormal; Notable for the following components:      Result Value   WBC 11.5 (*)    RBC 3.16 (*)    Hemoglobin 8.9 (*)    HCT 28.2 (*)    Platelets 623 (*)    Neutro Abs 8.2 (*)    Abs Immature  Granulocytes 0.35 (*)    All other components within normal limits  BASIC  METABOLIC PANEL  PREGNANCY, URINE    EKG None  Radiology CT Abdomen Pelvis W Contrast  Result Date: 01/15/2020 CLINICAL DATA:  Right lower abdominal pain, history of recent abdominal wall surgery EXAM: CT ABDOMEN AND PELVIS WITH CONTRAST TECHNIQUE: Multidetector CT imaging of the abdomen and pelvis was performed using the standard protocol following bolus administration of intravenous contrast. CONTRAST:  OMNIPAQUE IOHEXOL 300 MG/ML  SOLN COMPARISON:  01/10/2020 FINDINGS: Lower chest: No acute pleural or parenchymal lung disease. Hepatobiliary: Stable gallstones without evidence of cholecystitis. The liver is unremarkable. No biliary dilation. Pancreas: Unremarkable. No pancreatic ductal dilatation or surrounding inflammatory changes. Spleen: Normal in size without focal abnormality. Adrenals/Urinary Tract: Adrenal glands are unremarkable. Kidneys are normal, without renal calculi, focal lesion, or hydronephrosis. Bladder is unremarkable. Stomach/Bowel: No bowel obstruction or ileus. No wall thickening or inflammatory change. Normal appendix right lower quadrant. Vascular/Lymphatic: No significant vascular findings are present. No enlarged abdominal or pelvic lymph nodes. Reproductive: Uterus and bilateral adnexa are unremarkable. Other: The fat stranding throughout the anterior abdominal wall seen on prior study is more pronounced on this exam, now extending to the bilateral flanks. The surgical drain within the anterior abdominal wall seen previously has been removed in the interim. There are scattered areas of subcutaneous gas throughout the anterior abdominal wall. Within the left lower quadrant there is an accumulation of fluid measuring 11.8 x 1.4 cm in transverse dimension, extending approximately 4.6 cm in greatest craniocaudal dimension. There is no rim enhancement to suggest abscess at this time. A smaller fluid  collection is seen at the level the umbilicus, measuring 1.7 x 7.5 cm in transverse dimension and extending 4.8 cm in craniocaudal length. Overall, the appearances are consistent with seromas with no definitive signs of abscess at this time. There is no free intraperitoneal fluid or free intraperitoneal gas. No abdominal wall hernia. Musculoskeletal: No acute or destructive bony lesions. Reconstructed images demonstrate no additional findings. IMPRESSION: 1. Progressive fat stranding throughout the anterior abdominal wall, now extending to the bilateral flanks. 2. Interval removal of surgical drain, with developing fluid collections at the level of the umbilicus and within the left lower quadrant anterior abdominal wall as above. No rim enhancement to suggest abscess, and this likely reflects developing postoperative seromas. Continued follow-up may be useful. 3. Cholelithiasis without cholecystitis. Electronically Signed   By: Sharlet Salina M.D.   On: 01/15/2020 17:37    Procedures Procedures (including critical care time)  Medications Ordered in ED Medications  iohexol (OMNIPAQUE) 300 MG/ML solution 100 mL (100 mLs Intravenous Contrast Given 01/15/20 1656)    ED Course  I have reviewed the triage vital signs and the nursing notes.  Pertinent labs & imaging results that were available during my care of the patient were reviewed by me and considered in my medical decision making (see chart for details).  Clinical Course as of Jan 14 1841  Mon Jan 15, 2020  4189 39 year old female here with possible abdominal abscess after a tummy tuck done in [place].  She was recently admitted for IV antibiotics.  Complaining of increased swelling.  Afebrile borderline tachycardic nontoxic-appearing.  Bedside ultrasound appears to show fluid collection.  Getting labs and CT.   [MB]    Clinical Course User Index [MB] Terrilee Files, MD   MDM Rules/Calculators/A&P                          I have personally  reviewed all imaging,  labs and have interpreted them.  Patient did not appear to be any acute distress, vital signs reassuring, patient was alert and oriented.  exam showed induration felt in the right lower quadrant, no signs of obvious signs of infection noted, lung sounds clear bilaterally no signs of pedal edema.  Will obtain screening labs and CT abdomen pelvis looking for abscess or infection.  CBC shows leukocytosis which appears to be trending down, normocytic anemia which appears to be at baseline for patient.  BMP does not show electrolyte abnormalities no signs of AKI.  Imaging reveals progressive fat stranding throughout the anterior abdominal wall, developing fluid collections at the level of the umbilicus within the left lower quadrant most likely secondary seromas no signs of abscess seen.  I have low suspicion for systemic infection as patient was nontoxic-appearing, vital signs reassuring, no obvious source of infection seen on exam.  Low suspicion for deep tissue infection or abscess as there is no fluctuance felt on exam, no erythema present and it was nontender to palpation. imaging also did not find any likely abscesses.  Low suspicion for acute intra-abdominal abnormality requiring surgical intervention as patient denies abdominal pain, abdominal exam was benign, she is tolerating p.o. without difficulty.  Patient's wound cultures showed positive for staph pansensitive, patient is currently on correct antibiotics no need to change antibiotics.  Will write Dr. Hester MatesBillingham and inform that patient was seen here at the emergency department and needs close follow-up.  Patient appears to be resting calmly in bed show no acute signs distress, vital signs have remained stable does not meet criteria to be admitted to the hospital.  Likely patient has increasing seromas from her abdominal surgery.  Will recommend continuing on her antibiotics and following up with her surgeon for close management.   Patient was discussed with attending who was saw and evaluate the patient.  He agrees with assessment and plan patient is given at home care is with strict return precautions.  Patient verbalized that she understood and agreed with the plan. Final Clinical Impression(s) / ED Diagnoses Final diagnoses:  Encounter for post surgical wound check    Rx / DC Orders ED Discharge Orders    None       Carroll SageFaulkner, Deroy Noah J, PA-C 01/15/20 1842    Sabas SousBero, Michael M, MD 01/16/20 [phone]

## 2020-01-15 NOTE — Discharge Instructions (Signed)
You have been seen here for wound evaluation.  Lab work and imaging look reassuring.  I want you continue to take your antibiotics as prescribed.  Please do not try to drain fluid as the fluid is deeper than you think and this can increase the risk of infection.  I have given you the information for Dr. Hester Mates please call her office for an earlier appointment for further evaluation and management.  I want to come back to the emergency department if you develop increasing fever, abdominal pain, stomach pain, increased redness or swelling around the area, chest pain, shortness of breath, nausea, vomiting, diarrhea as these symptoms require further evaluation management.

## 2020-01-15 NOTE — ED Triage Notes (Signed)
Pt c/o abscess to right lower abd , POst op aug 4th " tummy tuck"

## 2020-01-16 ENCOUNTER — Telehealth: Payer: Self-pay
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
## 2020-01-23 ENCOUNTER — Other Ambulatory Visit: Payer: Self-pay

## 2020-01-23 ENCOUNTER — Ambulatory Visit (INDEPENDENT_AMBULATORY_CARE_PROVIDER_SITE_OTHER): Payer: Self-pay | Admitting: Plastic Surgery

## 2020-01-23 ENCOUNTER — Encounter: Payer: Self-pay | Admitting: Plastic Surgery

## 2020-01-23 VITALS — BP 110/72 | HR 85 | Temp 98.6°F | Ht 68.0 in

## 2020-01-23 DIAGNOSIS — T148XXA Other injury of unspecified body region, initial encounter: Secondary | ICD-10-CM

## 2020-01-23 DIAGNOSIS — T2112XA Burn of first degree of abdominal wall, initial encounter: Secondary | ICD-10-CM | POA: Insufficient documentation

## 2020-01-23 DIAGNOSIS — L089 Local infection of the skin and subcutaneous tissue, unspecified: Secondary | ICD-10-CM

## 2020-01-23 MED ORDER — SILVER SULFADIAZINE 1 % EX CREA
1.0000 | TOPICAL_CREAM | Freq: Two times a day (BID) | CUTANEOUS | 0 refills | Status: AC
Start: 2020-01-23 — End: ?
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
## 2020-01-30 ENCOUNTER — Telehealth (INDEPENDENT_AMBULATORY_CARE_PROVIDER_SITE_OTHER): Payer: BC Managed Care – PPO | Admitting: Internal Medicine

## 2020-01-30 DIAGNOSIS — Z09 Encounter for follow-up examination after completed treatment for conditions other than malignant neoplasm: Secondary | ICD-10-CM

## 2020-01-30 DIAGNOSIS — T148XXA Other injury of unspecified body region, initial encounter: Secondary | ICD-10-CM | POA: Diagnosis not present

## 2020-01-30 DIAGNOSIS — L089 Local infection of the skin and subcutaneous tissue, unspecified: Secondary | ICD-10-CM | POA: Diagnosis not present

## 2020-01-30 NOTE — Progress Notes (Signed)
Virtual Visit via Telephone Note  I connected with Nicole Christian, on 01/30/2020 at 10:30 AM by telephone due to the COVID-19 pandemic and verified that I am speaking with the correct person using two identifiers.   Consent: I discussed the limitations, risks, security and privacy concerns of performing an evaluation and management service by telephone and the availability of in person appointments. I also discussed with the patient that there may be a patient responsible charge related to this service. The patient expressed understanding and agreed to proceed.   Location of Patient: Home   Location of Provider: Clinic    Persons participating in Telemedicine visit: Clois Agatha [facility] Dr. Earlene Plater   History of Present Illness: Patient has a visit for hospital f/u. Was hospitalized from 8/11-8/14 for sepsis secondary to surgical wound infection s/p tummy tuck/abdominoplasty. She was discharged with course of Keflex. Returned to ED on 8/16 for concerns about her wound. CT abdomen obtained did not show any concern for abscess. She followed up with Dr. Ulice Bold on 8/24 and wound care recommendations were given.   Patient reports that when she was initially hospitalized, she had not been taking antibiotics post operatively has she had been prescribed. She reports that between hospitalization and follow up with plastic surgery, she burnt herself with heating pad trying to reduce seroma due to lack of feeling.   Patient is unsure of how her wound is healing. Not sure if its healing how it should be. Afebrile. No vomiting. No bleeding from surgical site. Does have a little bit of fluid draining mostly clear with some light pink tinge. Finished entire course of Keflex prescribed from hospitalization.     Hospital Course:   Recommendations for Outpatient Follow-up:  1. Follow up with PCP in 1-2 weeks as needed 2. Patient recommended to call Dr. Kittie Plater  office on Monday, 01/15/2020 for follow-up 3. Continue Keflex 500mg  PO QID x 10 days    Sepsis due to surgical wound infection: -Presented with fevers, tachycardia, tachypnea with borderline hypotension 94/67, leukocytosis, thrombocytosis, source likely due to abdominal wound infection -Patient was placed on IV cefepime, vancomycin. Blood cultures negative so far. JP drain output cultures growing staph aureus,Right-sided abdominal drain still with chylous/turbid output. -Cultures positive for MSSA, discussed with infection disease, Dr. 08-14-1999.  Reviewed Dr. Luciana Axe recommendations per her discussion with Dr. Kittie Plater, placed on oral Keflex 500 mg p.o. 4 times daily for 10 days.  Patient will follow up with Dr. Carmelina Noun next week.  Nonhealing postsurgical wound status post tummy tuck/abdominoplasty -Patient had tummy tuck/abdominoplasty done in [place], apparently discharged with abdominal drain in place to follow-up with PCP here in [place] -Patient had abdominal drainage with pain especially on the right side for few days with chills, reports that she did not take her antibiotics consistently.  CT did not show drainable abscess. -Continue oral antibiotics and outpatient follow-up with Dr. [place].  Mild hyperbilirubinemia:Likely related to cholestasis in the setting of acute infection. Monitor for now  Acute normocytic anemia: Patient's baseline hemoglobin appears to be around 12-13 (last level checked in July). Hemoglobin 8.2, could be secondary to blood loss from recent surgeryas well as slow oozing into JP drains.Also noted to have reactive thrombocytosis.   Mild hyponatremia/non-anion gap acidosis: Likely secondary to volume depletion. Improvedwith IV hydration.  Obesity Estimated body mass index is 32.23 kg/m as calculated from the following: Height as of this encounter: 5\' 8"  (1.727 m). Weight as of this encounter: 96.2 kg.  Past Medical  History:  Diagnosis Date  . Abnormal Pap smear    repeat was fine  . Bell's palsy   . Ovarian cyst   . Sciatica   . Sciatica    No Known Allergies  Current Outpatient Medications on File Prior to Visit  Medication Sig Dispense Refill  . acetaminophen (TYLENOL) 500 MG tablet Take 1,000 mg by mouth every 6 (six) hours as needed for moderate pain.     Marland Kitchen ibuprofen (ADVIL) 200 MG tablet Take 600 mg by mouth every 6 (six) hours as needed for moderate pain.     . silver sulfADIAZINE (SILVADENE) 1 % cream Apply 1 application topically 2 (two) times daily. Apply to burns twice a day 400 g 0   No current facility-administered medications on file prior to visit.    Observations/Objective: NAD. Speaking clearly.  Work of breathing normal.  Alert and oriented. Mood appropriate.   Assessment and Plan: 1. Hospital discharge follow-up 2. Wound infection No symptoms concerning for active infection. From report, seems to have normal serosanginous fluid drainage. Patient to send pictures of wound for further assessment. Instructed to f/u with plastic surgeon to monitor wound healing post op.    Follow Up Instructions: PRN and for routine medical care    I discussed the assessment and treatment plan with the patient. The patient was provided an opportunity to ask questions and all were answered. The patient agreed with the plan and demonstrated an understanding of the instructions.   The patient was advised to call back or seek an in-person evaluation if the symptoms worsen or if the condition fails to improve as anticipated.     I provided 14 minutes total of non-face-to-face time during this encounter including median intraservice time, reviewing previous notes, investigations, ordering medications, medical decision making, coordinating care and patient verbalized understanding at the end of the visit.    Marcy Siren, D.O. [facility] at [facility]  01/30/2020, 10:30 AM

## 2020-02-07 DIAGNOSIS — B348 Other viral infections of unspecified site: Secondary | ICD-10-CM | POA: Diagnosis not present

## 2020-02-08 DIAGNOSIS — Z20822 Contact with and (suspected) exposure to covid-19: Secondary | ICD-10-CM | POA: Diagnosis not present

## 2020-02-08 DIAGNOSIS — J069 Acute upper respiratory infection, unspecified: Secondary | ICD-10-CM | POA: Diagnosis not present

## 2020-02-09 ENCOUNTER — Other Ambulatory Visit: Payer: BC Managed Care – PPO

## 2020-02-10 DIAGNOSIS — Z20828 Contact with and (suspected) exposure to other viral communicable diseases: Secondary | ICD-10-CM | POA: Diagnosis not present

## 2020-03-12 DIAGNOSIS — Z1152 Encounter for screening for COVID-19: Secondary | ICD-10-CM | POA: Diagnosis not present

## 2020-05-23 DIAGNOSIS — R519 Headache, unspecified: Secondary | ICD-10-CM | POA: Diagnosis not present

## 2020-05-26 DIAGNOSIS — Z20822 Contact with and (suspected) exposure to covid-19: Secondary | ICD-10-CM | POA: Diagnosis not present

## 2020-06-12 DIAGNOSIS — Z20822 Contact with and (suspected) exposure to covid-19: Secondary | ICD-10-CM | POA: Diagnosis not present

## 2020-06-12 DIAGNOSIS — R059 Cough, unspecified: Secondary | ICD-10-CM | POA: Diagnosis not present

## 2020-11-11 DIAGNOSIS — R519 Headache, unspecified: Secondary | ICD-10-CM | POA: Diagnosis not present

## 2020-11-11 DIAGNOSIS — Z20822 Contact with and (suspected) exposure to covid-19: Secondary | ICD-10-CM | POA: Diagnosis not present

## 2021-05-21 ENCOUNTER — Other Ambulatory Visit: Payer: Self-pay

## 2021-05-21 ENCOUNTER — Telehealth (INDEPENDENT_AMBULATORY_CARE_PROVIDER_SITE_OTHER): Payer: BC Managed Care – PPO | Admitting: Nurse Practitioner

## 2021-05-21 ENCOUNTER — Encounter: Payer: Self-pay | Admitting: Nurse Practitioner

## 2021-05-21 DIAGNOSIS — Z20822 Contact with and (suspected) exposure to covid-19: Secondary | ICD-10-CM

## 2021-05-21 NOTE — Progress Notes (Signed)
Virtual Visit via Telephone Note  I connected with Nicole Christian on 05/21/21 at 10:40 AM EST by telephone and verified that I am speaking with the correct person using two identifiers.  Location: Patient: home Provider: office   I discussed the limitations, risks, security and privacy concerns of performing an evaluation and management service by telephone and the availability of in person appointments. I also discussed with the patient that there may be a patient responsible charge related to this service. The patient expressed understanding and agreed to proceed.   History of Present Illness:  Patient presents today through virtual visit for sick visit.  She states that her son tested positive for COVID through her home test this morning.  Her son was exposed through a school teammate this past week at school.  Patient states that she started having symptoms 3 days ago.  She states that her symptoms include headache, cough, sore throat, loss of taste.  We discussed that patient can come in this afternoon to have a respiratory panel completed.  Patient is fully vaccinated. Denies f/c/s, n/v/d, hemoptysis, PND, chest pain or edema.     Observations/Objective:  Vitals with BMI 01/23/2020 01/15/2020 01/15/2020  Height 5\' 8"  - -  Weight - - -  BMI - - -  Systolic 110 - 120  Diastolic 72 - 67  Pulse 85 90 94      Assessment and Plan:  Covid 19 exposure Cough:   Stay well hydrated  Stay active  Deep breathing exercises  May take tylenol or fever or pain  May take mucinex twice daily  Will order respiratory panel   Follow up:  Follow up if needed    I discussed the assessment and treatment plan with the patient. The patient was provided an opportunity to ask questions and all were answered. The patient agreed with the plan and demonstrated an understanding of the instructions.   The patient was advised to call back or seek an in-person evaluation if the  symptoms worsen or if the condition fails to improve as anticipated.  I provided 23 minutes of non-face-to-face time during this encounter.   , NP

## 2021-05-21 NOTE — Patient Instructions (Signed)
Covid 19 exposure Cough:   Stay well hydrated  Stay active  Deep breathing exercises  May take tylenol or fever or pain  May take mucinex twice daily  Will order respiratory panel   Follow up:  Follow up if needed

## 2021-05-22 DIAGNOSIS — Z20822 Contact with and (suspected) exposure to covid-19: Secondary | ICD-10-CM | POA: Diagnosis not present

## 2021-07-03 DIAGNOSIS — N62 Hypertrophy of breast: Secondary | ICD-10-CM | POA: Diagnosis not present

## 2021-07-03 DIAGNOSIS — G8929 Other chronic pain: Secondary | ICD-10-CM | POA: Diagnosis not present

## 2021-07-03 DIAGNOSIS — M542 Cervicalgia: Secondary | ICD-10-CM | POA: Diagnosis not present

## 2021-07-03 DIAGNOSIS — M549 Dorsalgia, unspecified: Secondary | ICD-10-CM | POA: Diagnosis not present

## 2021-12-20 IMAGING — CT CT ABD-PELV W/ CM
2 of 8 series · 13 of 46 positions shown, 18 images · IV contrast (omnipaque)
Comparison: None.

CLINICAL DATA: Recent tummy tuck and fever

EXAM:
CT ABDOMEN AND PELVIS WITH CONTRAST
TECHNIQUE: Multidetector CT imaging of the abdomen and pelvis was performed
using the standard protocol following bolus administration of
intravenous contrast.
CONTRAST:  100mL OMNIPAQUE IOHEXOL 300 MG/ML  SOLN

[Series 3: axial st · axial · 0.95mm/px · z∈[+759,+1154]mm · 10 of 93 slices shown, 15 images]
[im 7/93  soft-tissue]
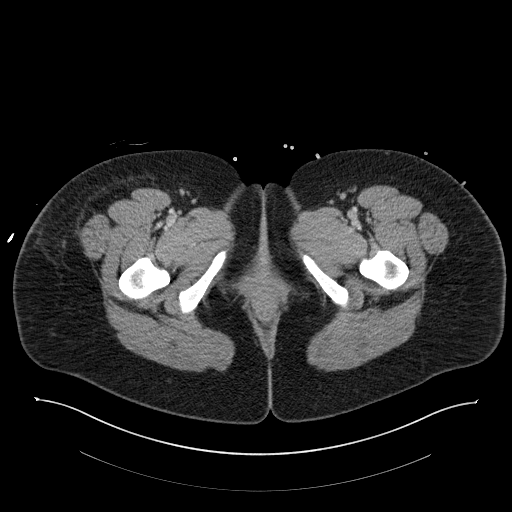
[im 7/93  bone]
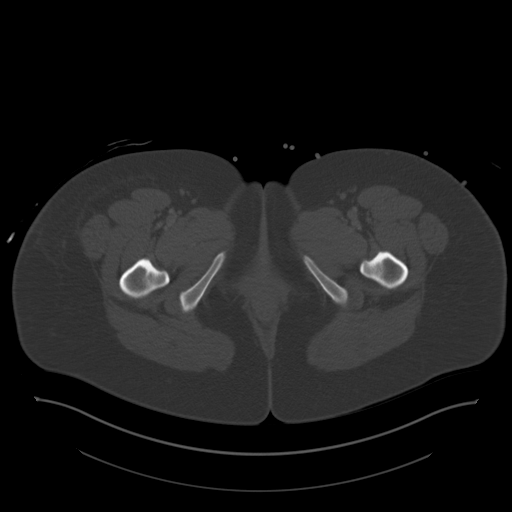
[im 20/93  soft-tissue]
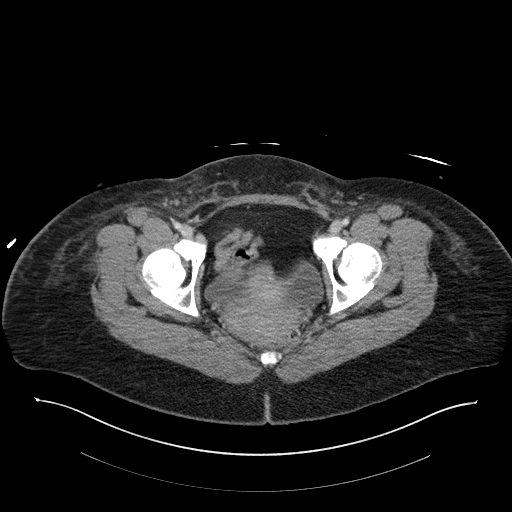
[im 27/93  soft-tissue]
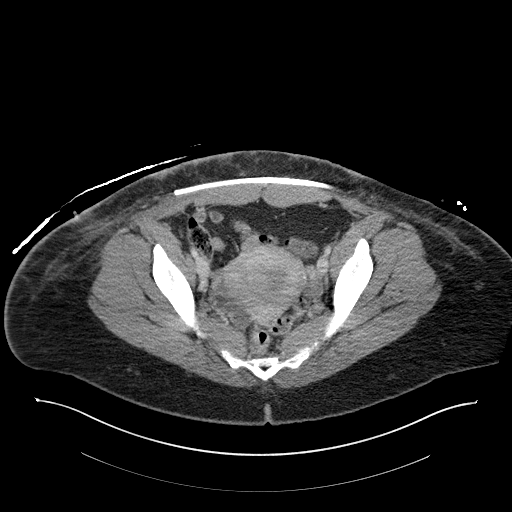
[im 40/93  soft-tissue]
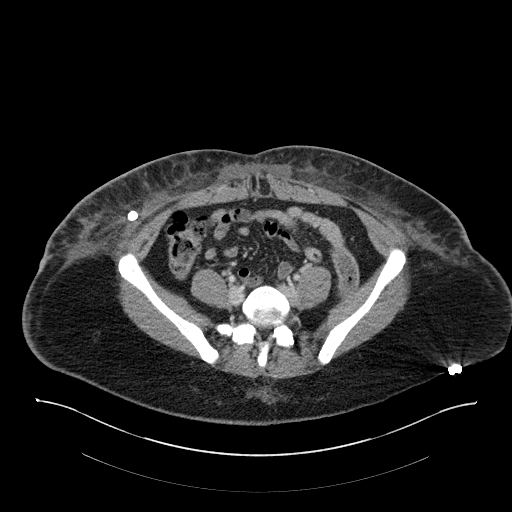
[im 47/93  soft-tissue]
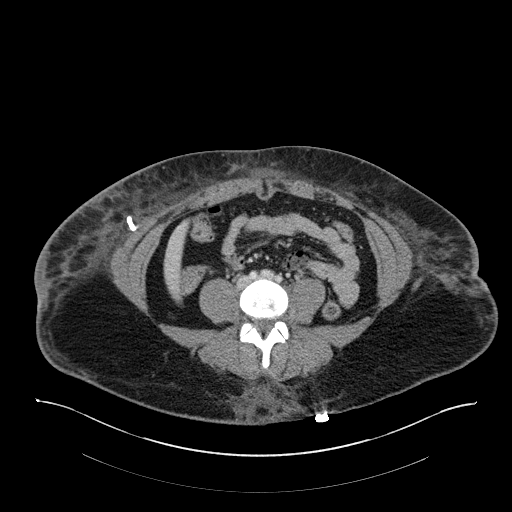
[im 53/93  soft-tissue]
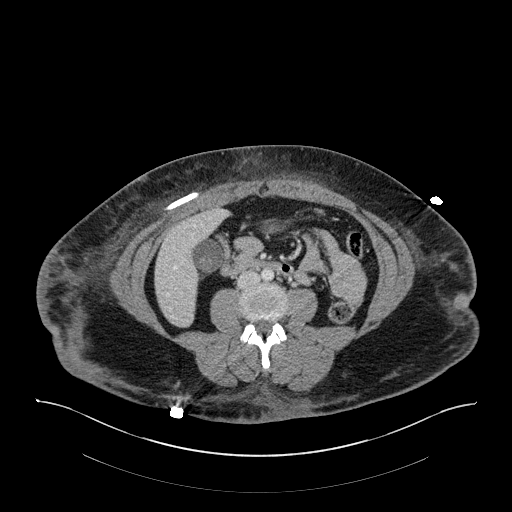
[im 66/93  soft-tissue]
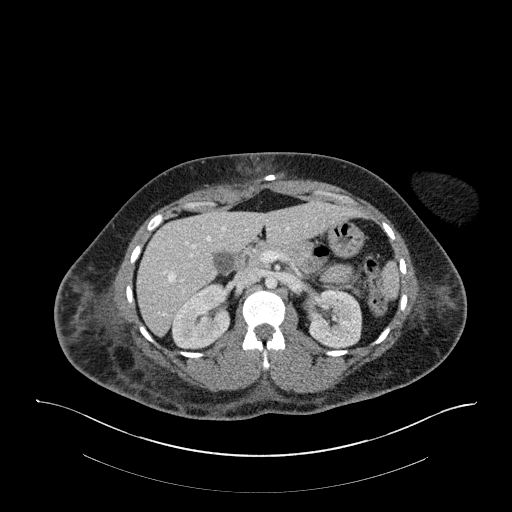
[im 66/93  lung]
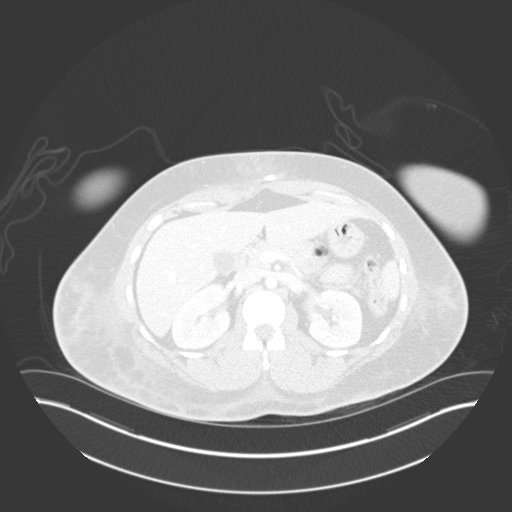
[im 73/93  soft-tissue]
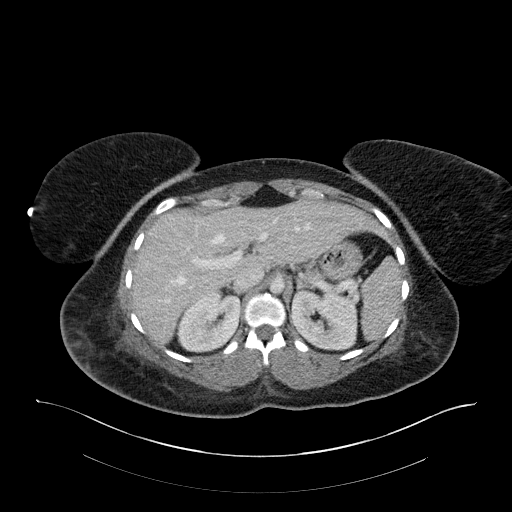
[im 73/93  lung]
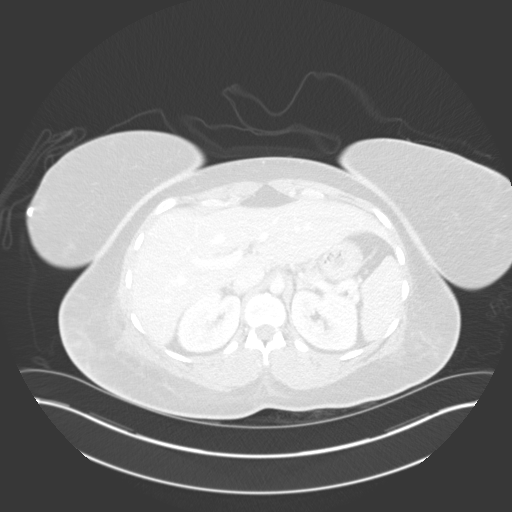
[im 79/93  lung]
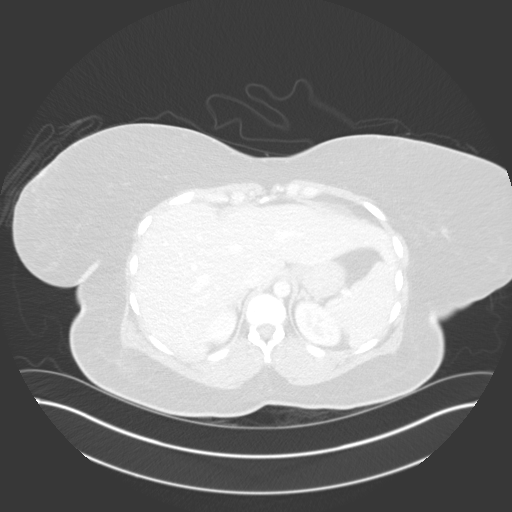
[im 86/93  soft-tissue]
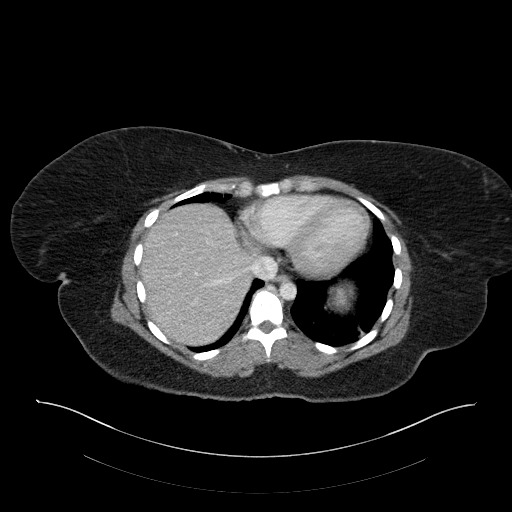
[im 86/93  lung]
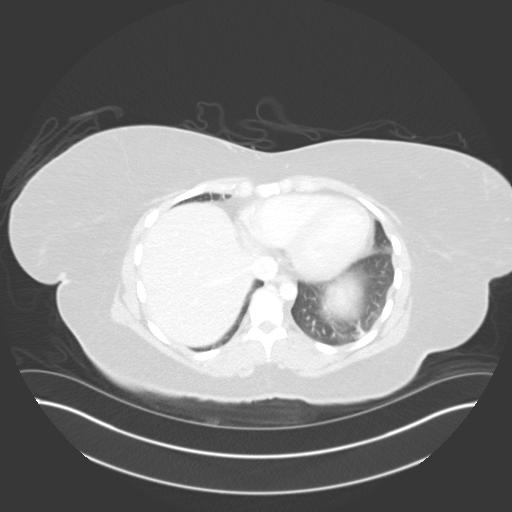
[im 86/93  bone]
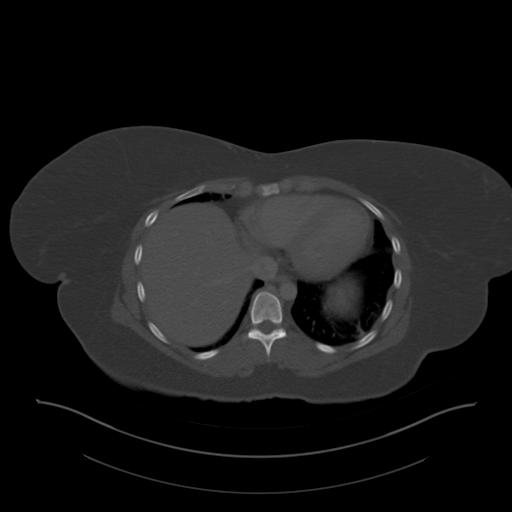

[Series 6: coronal st · coronal · 0.93mm/px · 3 of 146 slices shown]
[im 37/146  soft-tissue]
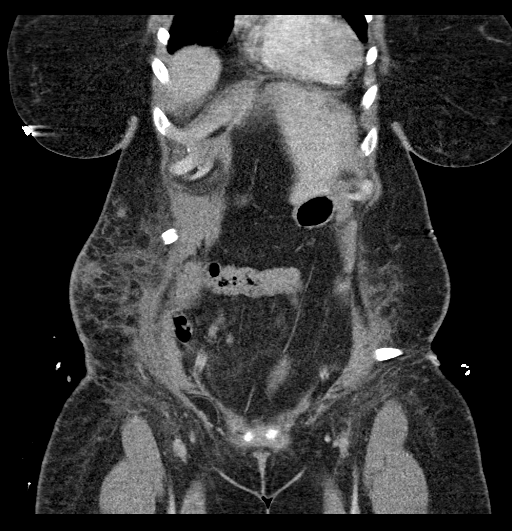
[im 73/146  soft-tissue]
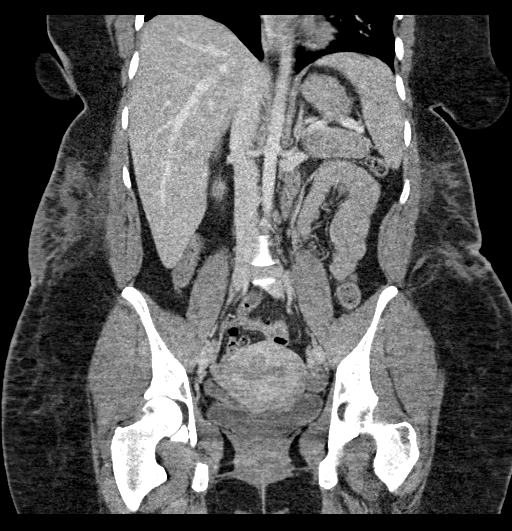
[im 109/146  soft-tissue]
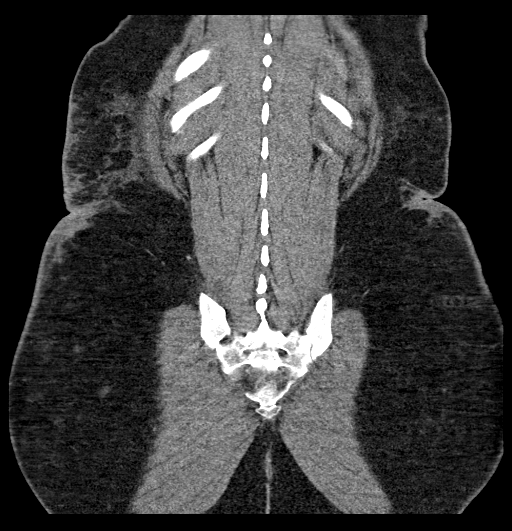

[13 of 46 positions shown; findings below may reference images not displayed]

FINDINGS: Lower chest: Bibasilar atelectasis.

Hepatobiliary: Focal fatty deposition adjacent to the falciform
ligament. Cholelithiasis. No intrahepatic or extrahepatic biliary
ductal dilation.

Pancreas: Unremarkable. No pancreatic ductal dilatation or
surrounding inflammatory changes.

Spleen: Normal in size without focal abnormality.

Adrenals/Urinary Tract: Adrenal glands are unremarkable. Kidneys are
normal, without renal calculi, focal lesion, or hydronephrosis.
Bladder is unremarkable.

Stomach/Bowel: Stomach is within normal limits. No evidence of
appendicitis. No evidence of bowel wall thickening, distention, or
inflammatory changes.

Vascular/Lymphatic: No significant vascular findings are present. No
enlarged abdominal or pelvic lymph nodes.

Reproductive: 2 cm cystic lesion of the RIGHT adnexa likely a
dominant follicle. Fibroid uterus.

Other: Extensive fat stranding of the anterior and posterior soft
tissues consistent with history of recent abdominoplasty. There are
2 surgical drains in the abdomen. No focal drainable fluid
collection within the anterior abdominal wall. There is trace fluid
more focally confluent adjacent to the umbilicus. There is trace
subcutaneous air along the tracks of the surgical drains.

Musculoskeletal: No acute or significant osseous findings.
IMPRESSION: 1. Extensive fat stranding of the anterior and posterior superficial
soft tissues consistent with history of recent abdominoplasty. No
focal drainable fluid collection within the anterior abdominal wall.
No intra-abdominal extension.
2. Cholelithiasis without evidence of cholecystitis.

## 2021-12-25 IMAGING — CT CT ABD-PELV W/ CM
2 of 4 series · 16 of 46 positions shown, 18 images · IV contrast (omnipaque)
Comparison: 01/10/2020

CLINICAL DATA: Right lower abdominal pain, history of recent
abdominal wall surgery

EXAM:
CT ABDOMEN AND PELVIS WITH CONTRAST
TECHNIQUE: Multidetector CT imaging of the abdomen and pelvis was performed
using the standard protocol following bolus administration of
intravenous contrast.
CONTRAST:  100mL OMNIPAQUE IOHEXOL 300 MG/ML  SOLN

[Series 2: axial st · axial · 0.88mm/px · z∈[-774,-370]mm · 13 of 89 slices shown, 15 images]
[im 4/89  soft-tissue]
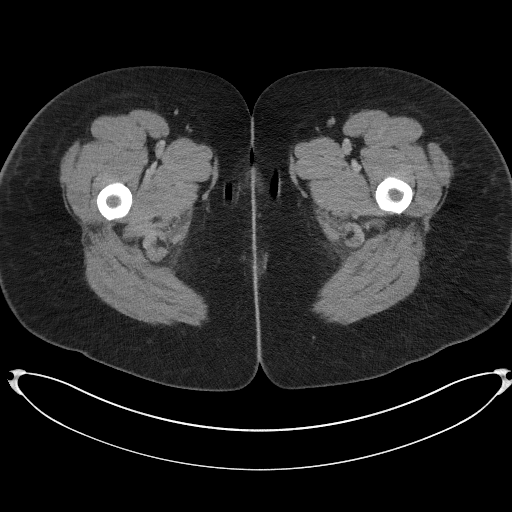
[im 4/89  bone]
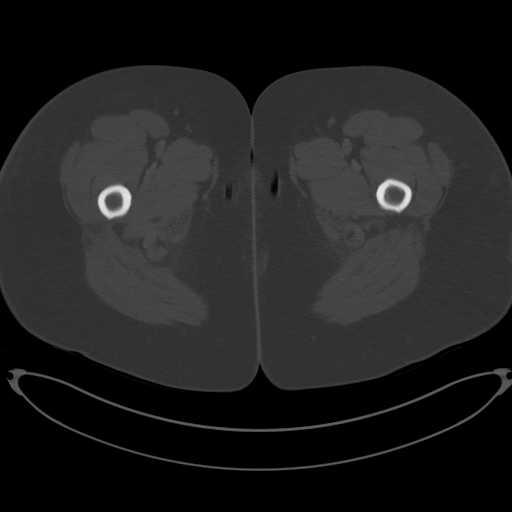
[im 12/89  soft-tissue]
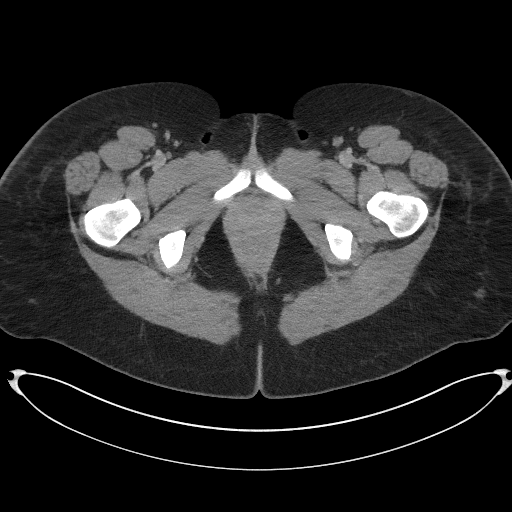
[im 20/89  soft-tissue]
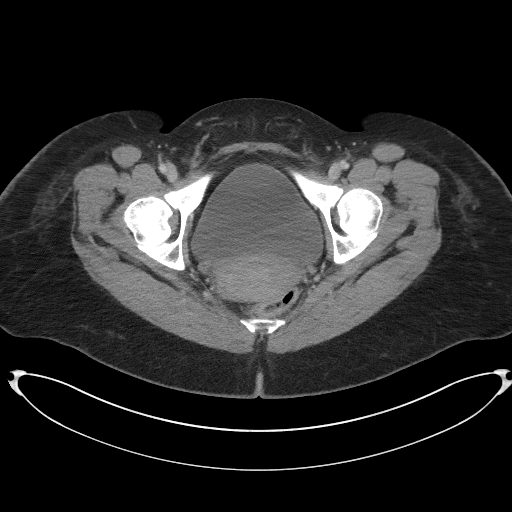
[im 23/89  soft-tissue]
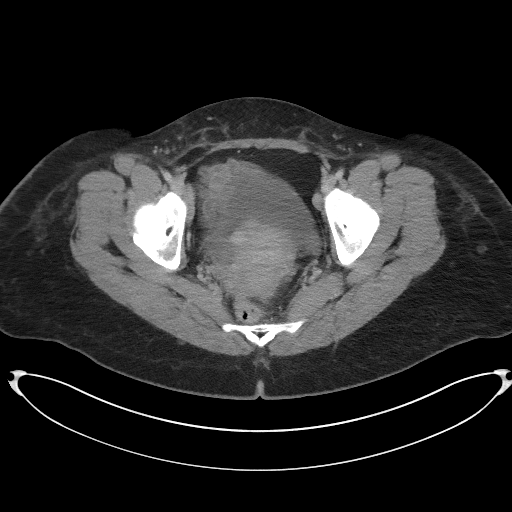
[im 31/89  soft-tissue]
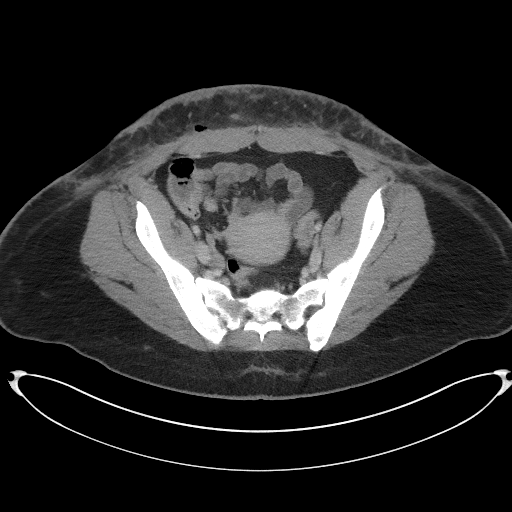
[im 39/89  soft-tissue]
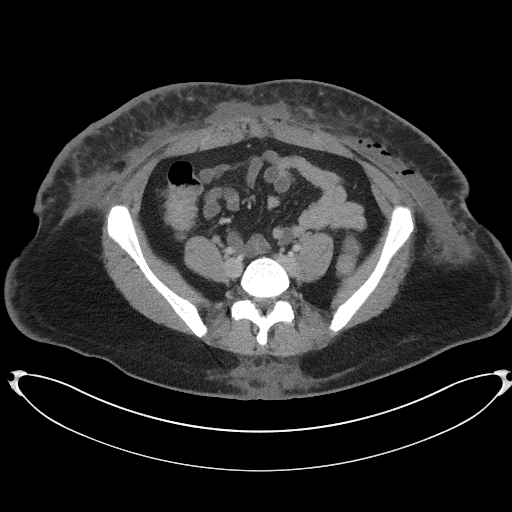
[im 46/89  soft-tissue]
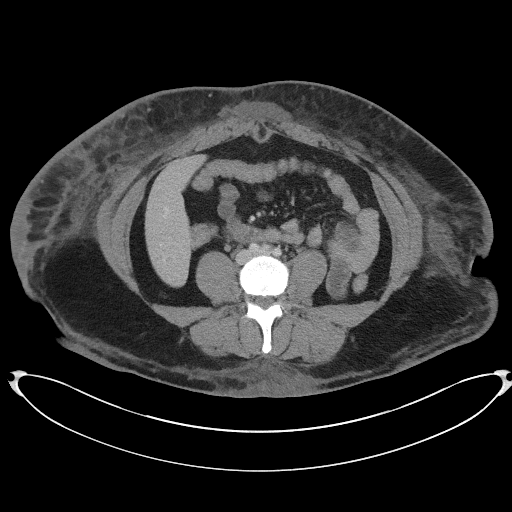
[im 50/89  soft-tissue]
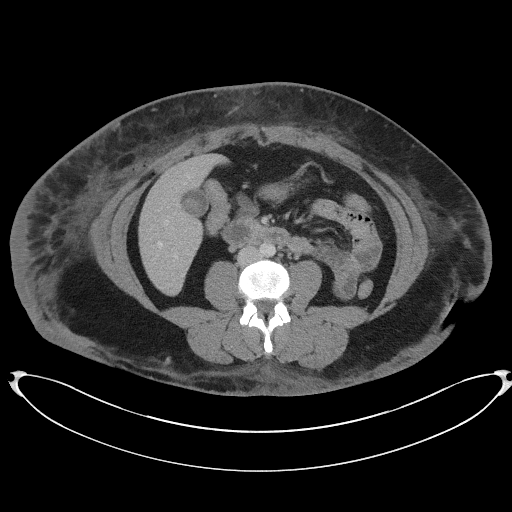
[im 58/89  soft-tissue]
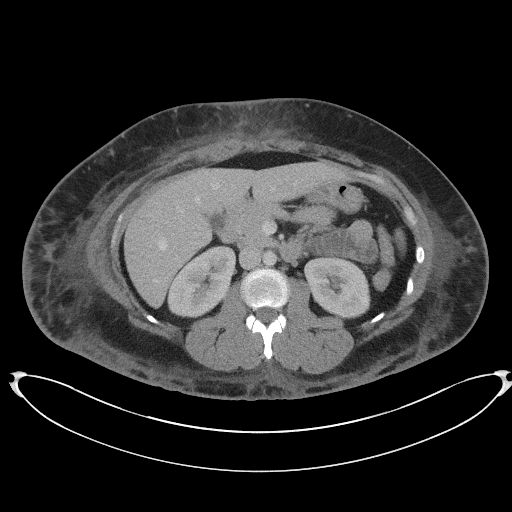
[im 58/89  bone]
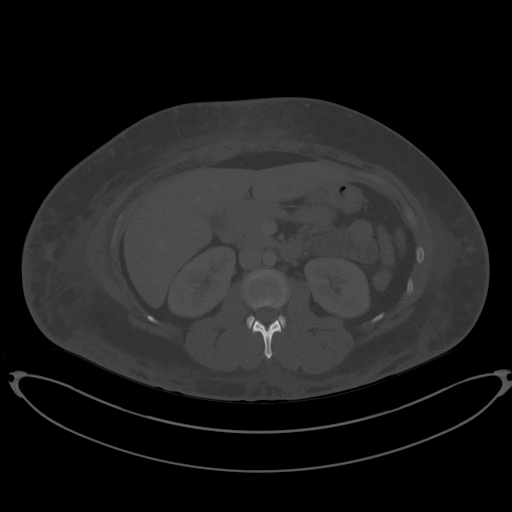
[im 66/89  soft-tissue]
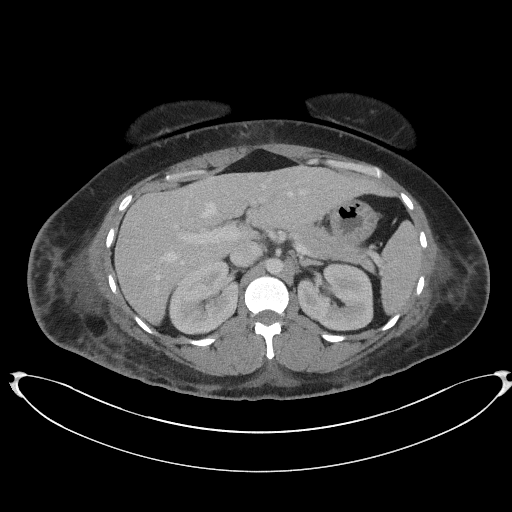
[im 69/89  soft-tissue]
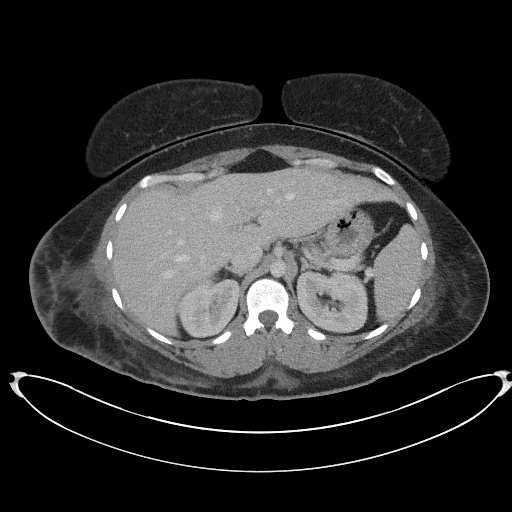
[im 77/89  soft-tissue]
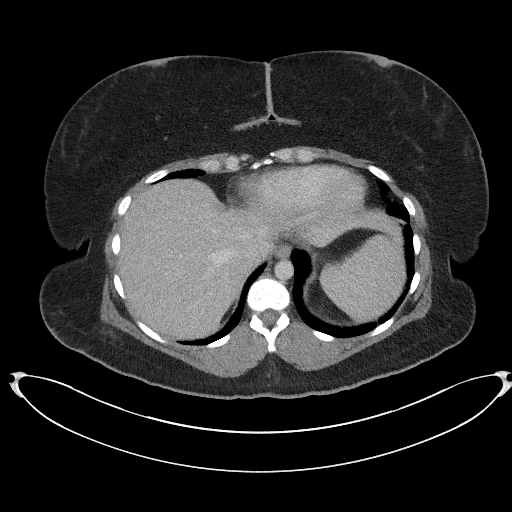
[im 85/89  soft-tissue]
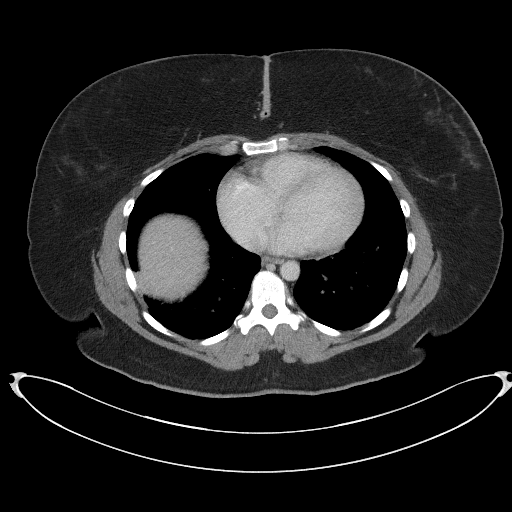

[Series 5: coronal st · coronal · 0.89mm/px · 3 of 112 slices shown]
[im 38/112  soft-tissue]
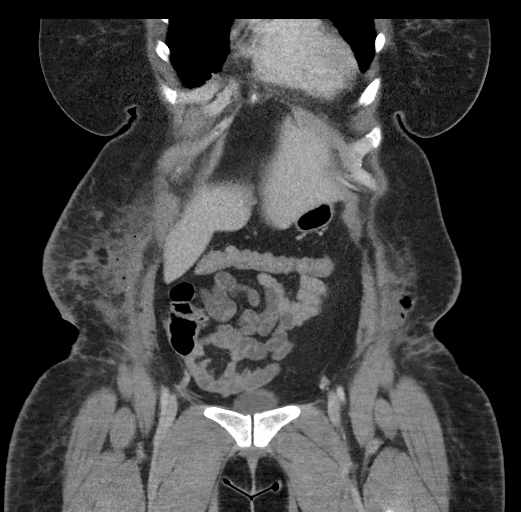
[im 50/112  soft-tissue]
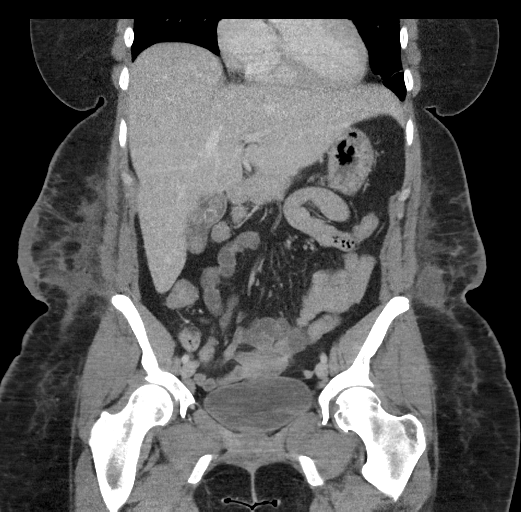
[im 62/112  soft-tissue]
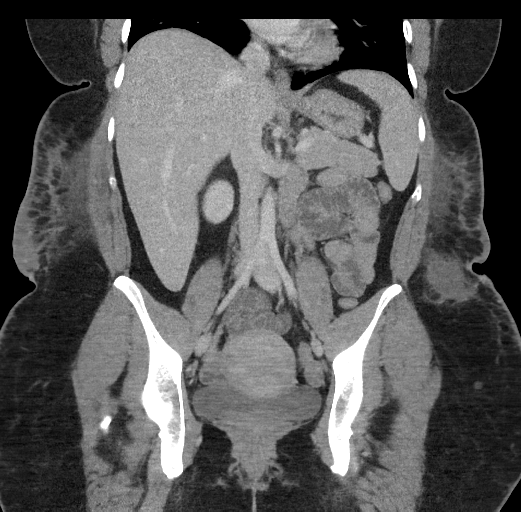

[16 of 46 positions shown; findings below may reference images not displayed]

FINDINGS: Lower chest: No acute pleural or parenchymal lung disease.

Hepatobiliary: Stable gallstones without evidence of cholecystitis.
The liver is unremarkable. No biliary dilation.

Pancreas: Unremarkable. No pancreatic ductal dilatation or
surrounding inflammatory changes.

Spleen: Normal in size without focal abnormality.

Adrenals/Urinary Tract: Adrenal glands are unremarkable. Kidneys are
normal, without renal calculi, focal lesion, or hydronephrosis.
Bladder is unremarkable.

Stomach/Bowel: No bowel obstruction or ileus. No wall thickening or
inflammatory change. Normal appendix right lower quadrant.

Vascular/Lymphatic: No significant vascular findings are present. No
enlarged abdominal or pelvic lymph nodes.

Reproductive: Uterus and bilateral adnexa are unremarkable.

Other: The fat stranding throughout the anterior abdominal wall seen
on prior study is more pronounced on this exam, now extending to the
bilateral flanks. The surgical drain within the anterior abdominal
wall seen previously has been removed in the interim. There are
scattered areas of subcutaneous gas throughout the anterior
abdominal wall.

Within the left lower quadrant there is an accumulation of fluid
measuring 11.8 x 1.4 cm in transverse dimension, extending
approximately 4.6 cm in greatest craniocaudal dimension. There is no
rim enhancement to suggest abscess at this time. A smaller fluid
collection is seen at the level the umbilicus, measuring 1.7 x
cm in transverse dimension and extending 4.8 cm in craniocaudal
length. Overall, the appearances are consistent with seromas with no
definitive signs of abscess at this time.

There is no free intraperitoneal fluid or free intraperitoneal gas.
No abdominal wall hernia.

Musculoskeletal: No acute or destructive bony lesions. Reconstructed
images demonstrate no additional findings.
IMPRESSION: 1. Progressive fat stranding throughout the anterior abdominal wall,
now extending to the bilateral flanks.
2. Interval removal of surgical drain, with developing fluid
collections at the level of the umbilicus and within the left lower
quadrant anterior abdominal wall as above. No rim enhancement to
suggest abscess, and this likely reflects developing postoperative
seromas. Continued follow-up may be useful.
3. Cholelithiasis without cholecystitis.

## 2023-07-15 DIAGNOSIS — R1084 Generalized abdominal pain: Secondary | ICD-10-CM | POA: Diagnosis not present
